# Patient Record
Sex: Female | Born: 1995
Health system: Southern US, Community
[De-identification: ages and names within clinical notes are randomized; demographics above are authoritative.]

## PROBLEM LIST (undated history)

## (undated) DIAGNOSIS — R51 Headache: Secondary | ICD-10-CM

## (undated) DIAGNOSIS — J45909 Unspecified asthma, uncomplicated: Secondary | ICD-10-CM

## (undated) DIAGNOSIS — R519 Headache, unspecified: Secondary | ICD-10-CM

## (undated) DIAGNOSIS — T7840XA Allergy, unspecified, initial encounter: Secondary | ICD-10-CM

## (undated) DIAGNOSIS — D649 Anemia, unspecified: Secondary | ICD-10-CM

## (undated) HISTORY — DX: Unspecified asthma, uncomplicated: J45.909

## (undated) HISTORY — PX: MOUTH SURGERY: SHX715

## (undated) HISTORY — DX: Headache, unspecified: R51.9

## (undated) HISTORY — DX: Anemia, unspecified: D64.9

## (undated) HISTORY — DX: Headache: R51

## (undated) HISTORY — DX: Allergy, unspecified, initial encounter: T78.40XA

---
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
## 2017-02-13 ENCOUNTER — Ambulatory Visit: Payer: BLUE CROSS/BLUE SHIELD | Admitting: Obstetrics & Gynecology

## 2017-02-19 ENCOUNTER — Ambulatory Visit (INDEPENDENT_AMBULATORY_CARE_PROVIDER_SITE_OTHER): Payer: BLUE CROSS/BLUE SHIELD | Admitting: Obstetrics and Gynecology

## 2017-02-19 ENCOUNTER — Encounter: Payer: Self-pay | Admitting: Obstetrics and Gynecology

## 2017-02-19 VITALS — BP 121/82 | HR 92 | Ht 62.86 in | Wt 197.8 lb

## 2017-02-19 DIAGNOSIS — Z113 Encounter for screening for infections with a predominantly sexual mode of transmission: Secondary | ICD-10-CM | POA: Diagnosis not present

## 2017-02-19 DIAGNOSIS — N761 Subacute and chronic vaginitis: Secondary | ICD-10-CM | POA: Diagnosis not present

## 2017-02-19 NOTE — Patient Instructions (Addendum)
Contraception Choices Contraception (birth control) is the use of any methods or devices to prevent pregnancy. Below are some methods to help avoid pregnancy. Hormonal methods  Contraceptive implant. This is a thin, plastic tube containing progesterone hormone. It does not contain estrogen hormone. Your health care provider inserts the tube in the inner part of the upper arm. The tube can remain in place for up to 3 years. After 3 years, the implant must be removed. The implant prevents the ovaries from releasing an egg (ovulation), thickens the cervical mucus to prevent sperm from entering the uterus, and thins the lining of the inside of the uterus.  Progesterone-only injections. These injections are given every 3 months by your health care provider to prevent pregnancy. This synthetic progesterone hormone stops the ovaries from releasing eggs. It also thickens cervical mucus and changes the uterine lining. This makes it harder for sperm to survive in the uterus.  Birth control pills. These pills contain estrogen and progesterone hormone. They work by preventing the ovaries from releasing eggs (ovulation). They also cause the cervical mucus to thicken, preventing the sperm from entering the uterus. Birth control pills are prescribed by a health care provider.Birth control pills can also be used to treat heavy periods.  Minipill. This type of birth control pill contains only the progesterone hormone. They are taken every day of each month and must be prescribed by your health care provider.  Birth control patch. The patch contains hormones similar to those in birth control pills. It must be changed once a week and is prescribed by a health care provider.  Vaginal ring. The ring contains hormones similar to those in birth control pills. It is left in the vagina for 3 weeks, removed for 1 week, and then a new one is put back in place. The patient must be comfortable inserting and removing the ring from  the vagina.A health care provider's prescription is necessary.  Emergency contraception. Emergency contraceptives prevent pregnancy after unprotected sexual intercourse. This pill can be taken right after sex or up to 5 days after unprotected sex. It is most effective the sooner you take the pills after having sexual intercourse. Most emergency contraceptive pills are available without a prescription. Check with your pharmacist. Do not use emergency contraception as your only form of birth control. Barrier methods  Female condom. This is a thin sheath (latex or rubber) that is worn over the penis during sexual intercourse. It can be used with spermicide to increase effectiveness.  Female condom. This is a soft, loose-fitting sheath that is put into the vagina before sexual intercourse.  Diaphragm. This is a soft, latex, dome-shaped barrier that must be fitted by a health care provider. It is inserted into the vagina, along with a spermicidal jelly. It is inserted before intercourse. The diaphragm should be left in the vagina for 6 to 8 hours after intercourse.  Cervical cap. This is a round, soft, latex or plastic cup that fits over the cervix and must be fitted by a health care provider. The cap can be left in place for up to 48 hours after intercourse.  Sponge. This is a soft, circular piece of polyurethane foam. The sponge has spermicide in it. It is inserted into the vagina after wetting it and before sexual intercourse.  Spermicides. These are chemicals that kill or block sperm from entering the cervix and uterus. They come in the form of creams, jellies, suppositories, foam, or tablets. They do not require a prescription. They   are inserted into the vagina with an applicator before having sexual intercourse. The process must be repeated every time you have sexual intercourse. Intrauterine contraception  Intrauterine device (IUD). This is a T-shaped device that is put in a woman's uterus during  a menstrual period to prevent pregnancy. There are 2 types: ? Copper IUD. This type of IUD is wrapped in copper wire and is placed inside the uterus. Copper makes the uterus and fallopian tubes produce a fluid that kills sperm. It can stay in place for 10 years. ? Hormone IUD. This type of IUD contains the hormone progestin (synthetic progesterone). The hormone thickens the cervical mucus and prevents sperm from entering the uterus, and it also thins the uterine lining to prevent implantation of a fertilized egg. The hormone can weaken or kill the sperm that get into the uterus. It can stay in place for 3-5 years, depending on which type of IUD is used. Permanent methods of contraception  Female tubal ligation. This is when the woman's fallopian tubes are surgically sealed, tied, or blocked to prevent the egg from traveling to the uterus.  Hysteroscopic sterilization. This involves placing a small coil or insert into each fallopian tube. Your doctor uses a technique called hysteroscopy to do the procedure. The device causes scar tissue to form. This results in permanent blockage of the fallopian tubes, so the sperm cannot fertilize the egg. It takes about 3 months after the procedure for the tubes to become blocked. You must use another form of birth control for these 3 months.  Female sterilization. This is when the female has the tubes that carry sperm tied off (vasectomy).This blocks sperm from entering the vagina during sexual intercourse. After the procedure, the man can still ejaculate fluid (semen). Natural planning methods  Natural family planning. This is not having sexual intercourse or using a barrier method (condom, diaphragm, cervical cap) on days the woman could become pregnant.  Calendar method. This is keeping track of the length of each menstrual cycle and identifying when you are fertile.  Ovulation method. This is avoiding sexual intercourse during ovulation.  Symptothermal method.  This is avoiding sexual intercourse during ovulation, using a thermometer and ovulation symptoms.  Post-ovulation method. This is timing sexual intercourse after you have ovulated. Regardless of which type or method of contraception you choose, it is important that you use condoms to protect against the transmission of sexually transmitted infections (STIs). Talk with your health care provider about which form of contraception is most appropriate for you. This information is not intended to replace advice given to you by your health care provider. Make sure you discuss any questions you have with your health care provider. Document Released: 05/01/2005 Document Revised: 10/07/2015 Document Reviewed: 10/24/2012 Elsevier Interactive Patient Education  2017 [employer]   Vaginitis Vaginitis is a condition in which the vaginal tissue swells and becomes red (inflamed). This condition is most often caused by a change in the normal balance of bacteria and yeast that live in the vagina. This change causes an overgrowth of certain bacteria or yeast, which causes the inflammation. There are different types of vaginitis, but the most common types are:  Bacterial vaginosis.  Yeast infection (candidiasis).  Trichomoniasis vaginitis. This is a sexually transmitted disease (STD).  Viral vaginitis.  Atrophic vaginitis.  Allergic vaginitis.  What are the causes? The cause of this condition depends on the type of vaginitis. It can be caused by:  Bacteria (bacterial vaginosis).  Yeast, which is a  fungus (yeast infection).  A parasite (trichomoniasis vaginitis).  A virus (viral vaginitis).  Low hormone levels (atrophic vaginitis). Low hormone levels can occur during pregnancy, breastfeeding, or after menopause.  Irritants, such as bubble baths, scented tampons, and feminine sprays (allergic vaginitis).  Other factors can change the normal balance of the yeast and bacteria that live in the  vagina. These include:  Antibiotic medicines.  Poor hygiene.  Diaphragms, vaginal sponges, spermicides, birth control pills, and intrauterine devices (IUD).  Sex.  Infection.  Uncontrolled diabetes.  A weakened defense (immune) system.  What increases the risk? This condition is more likely to develop in women who:  Smoke.  Use vaginal douches, scented tampons, or scented sanitary pads.  Wear tight-fitting pants.  Wear thong underwear.  Use oral birth control pills or an IUD.  Have sex without a condom.  Have multiple sex partners.  Have an STD.  Frequently use the spermicide [id number]-9.  Eat lots of foods high in sugar.  Have uncontrolled diabetes.  Have low estrogen levels.  Have a weakened immune system from an immune disorder or medical treatment.  Are pregnant or breastfeeding.  What are the signs or symptoms? Symptoms vary depending on the cause of the vaginitis. Common symptoms include:  Abnormal vaginal discharge. ? The discharge is [place], gray, or yellow with bacterial vaginosis. ? The discharge is thick, Ever[place], and cheesy with a yeast infection. ? The discharge is frothy and yellow or greenish with trichomoniasis.  A bad vaginal smell. The smell is fishy with bacterial vaginosis.  Vaginal itching, pain, or swelling.  Sex that is painful.  Pain or burning when urinating.  Sometimes there are no symptoms. How is this diagnosed? This condition is diagnosed based on your symptoms and medical history. A physical exam, including a pelvic exam, will also be done. You may also have other tests, including:  Tests to determine the pH level (acidity or alkalinity) of your vagina.  A whiff test, to assess the odor that results when a sample of your vaginal discharge is mixed with a potassium hydroxide solution.  Tests of vaginal fluid. A sample will be examined under a microscope.  How is this treated? Treatment varies depending on the type of  vaginitis you have. Your treatment may include:  Antibiotic creams or pills to treat bacterial vaginosis and trichomoniasis.  Antifungal medicines, such as vaginal creams or suppositories, to treat a yeast infection.  Medicine to ease discomfort if you have viral vaginitis. Your sexual partner should also be treated.  Estrogen delivered in a cream, pill, suppository, or vaginal ring to treat atrophic vaginitis. If vaginal dryness occurs, lubricants and moisturizing creams may help. You may need to avoid scented soaps, sprays, or douches.  Stopping use of a product that is causing allergic vaginitis. Then using a vaginal cream to treat the symptoms.  Follow these instructions at home: Lifestyle  Keep your genital area clean and dry. Avoid soap, and only rinse the area with water.  Do not douche or use tampons until your health care provider says it is okay to do so. Use sanitary pads, if needed.  Do not have sex until your health care provider approves. When you can return to sex, practice safe sex and use condoms.  Wipe from front to back. This avoids the spread of bacteria from the rectum to the vagina. General instructions  Take over-the-counter and prescription medicines only as told by your health care provider.  If you were prescribed an antibiotic medicine,  take or use it as told by your health care provider. Do not stop taking or using the antibiotic even if you start to feel better.  Keep all follow-up visits as told by your health care provider. This is important. How is this prevented?  Use mild, non-scented products. Do not use things that can irritate the vagina, such as fabric softeners. Avoid the following products if they are scented: ? Feminine sprays. ? Detergents. ? Tampons. ? Feminine hygiene products. ? Soaps or bubble baths.  Let air reach your genital area. ? Wear cotton underwear to reduce moisture buildup. ? Avoid wearing underwear while you  sleep. ? Avoid wearing tight pants and underwear or nylons without a cotton panel. ? Avoid wearing thong underwear.  Take off any wet clothing, such as bathing suits, as soon as possible.  Practice safe sex and use condoms. Contact a health care provider if:  You have abdominal pain.  You have a fever.  You have symptoms that last for more than 2-3 days. Get help right away if:  You have a fever and your symptoms suddenly get worse. Summary  Vaginitis is a condition in which the vaginal tissue becomes inflamed.This condition is most often caused by a change in the normal balance of bacteria and yeast that live in the vagina.  Treatment varies depending on the type of vaginitis you have.  Do not douche, use tampons , or have sex until your health care provider approves. When you can return to sex, practice safe sex and use condoms. This information is not intended to replace advice given to you by your health care provider. Make sure you discuss any questions you have with your health care provider. Document Released: 02/26/2007 Document Revised: 06/06/2016 Document Reviewed: 06/06/2016 Elsevier Interactive Patient Education  [employer].

## 2017-02-19 NOTE — Progress Notes (Signed)
21 yo G0 here for the evaluation of abnormal vaginal discharge which has been present daily for the past 2 months. Patient reports the presence of a Odom-grayish discharge at times with a foul odor and at times associated with pruritis. She is sexually active without complaints. She uses condoms intermittently with the same partner. She is not seeking pregnancy. She reports a normal monthly period of 5 days. She denies any pelvic pain.  Past Medical History:  Diagnosis Date  . Allergy   . Anemia   . Asthma   . Headache    Past Surgical History:  Procedure Laterality Date  . MOUTH SURGERY    . TONSILLECTOMY AND ADENOIDECTOMY Bilateral 2011   Family History  Problem Relation Age of Onset  . Migraines Mother   . ADD / ADHD Mother   . Depression Mother   . Anxiety disorder Mother   . Alcohol abuse Father   . Migraines Maternal Grandmother   . Asthma Maternal Grandfather    Social History  Substance Use Topics  . Smoking status: Never Smoker  . Smokeless tobacco: Never Used  . Alcohol use No   ROS See pertinent in HPI  Blood pressure 121/82, pulse 92, height 5' 2.86" (1.597 m), weight 197 lb 12.8 oz (89.7 kg), last menstrual period 01/23/2017. GENERAL: Well-developed, well-nourished female in no acute distress.  ABDOMEN: Soft, nontender, nondistended. No organomegaly. PELVIC: Normal external female genitalia. Vagina is pink and rugated.  Normal discharge. Normal appearing cervix. Uterus is normal in size. No adnexal mass or tenderness. EXTREMITIES: No cyanosis, clubbing, or edema, 2+ distal pulses.  A/P 21 yo G0 with chronic vaginitis - cultures and wet prep collected - Discussed contraception options but patient is undecided at the moment - Reviewed most fertile time during menstrual cycle and encouraged patient to use condoms or abstain during that time - RTC when ready for contraception - reviewed perineal hygiene - Patient will be contacted with abnormal results

## 2017-02-20 LAB — CERVICOVAGINAL ANCILLARY ONLY
Bacterial vaginitis: POSITIVE — AB
Candida vaginitis: NEGATIVE
Chlamydia: NEGATIVE
Neisseria Gonorrhea: NEGATIVE
Trichomonas: NEGATIVE

## 2017-02-21 ENCOUNTER — Other Ambulatory Visit: Payer: Self-pay | Admitting: Obstetrics and Gynecology

## 2017-02-21 ENCOUNTER — Telehealth: Payer: Self-pay

## 2017-02-21 MED ORDER — METRONIDAZOLE 500 MG PO TABS: 500.0000 mg | ORAL_TABLET | Freq: Two times a day (BID) | ORAL | 0 refills | Status: DC

## 2017-02-21 NOTE — Telephone Encounter (Signed)
Pt informed of results, and that rx has been sent to pharmacy 

## 2017-02-21 NOTE — Telephone Encounter (Signed)
-----   Message from Catalina Antigua, MD sent at 02/21/2017 10:56 AM EDT ----- Please inform patient of BV infection. Rx has been e-prescribed  Animator

## 2017-03-06 ENCOUNTER — Telehealth: Payer: Self-pay

## 2017-03-06 MED ORDER — FLUCONAZOLE 150 MG PO TABS
150.0000 mg | ORAL_TABLET | Freq: Once | ORAL | 0 refills | Status: AC
Start: 2017-03-06 — End: 2017-03-06

## 2017-03-06 NOTE — Telephone Encounter (Signed)
Pt now aware

## 2017-03-06 NOTE — Telephone Encounter (Signed)
Received VM from pt. Recent ABx use. Pt c/o yeast infection. Unable to LVM for pt.

## 2017-03-22 DIAGNOSIS — Z23 Encounter for immunization: Secondary | ICD-10-CM | POA: Diagnosis not present

## 2017-05-16 ENCOUNTER — Encounter: Payer: Self-pay | Admitting: Obstetrics and Gynecology

## 2017-05-16 ENCOUNTER — Ambulatory Visit (INDEPENDENT_AMBULATORY_CARE_PROVIDER_SITE_OTHER): Payer: BLUE CROSS/BLUE SHIELD | Admitting: Obstetrics and Gynecology

## 2017-05-16 VITALS — BP 141/82 | HR 120 | Wt 204.7 lb

## 2017-05-16 DIAGNOSIS — N760 Acute vaginitis: Secondary | ICD-10-CM

## 2017-05-16 DIAGNOSIS — Z113 Encounter for screening for infections with a predominantly sexual mode of transmission: Secondary | ICD-10-CM | POA: Diagnosis not present

## 2017-05-16 NOTE — Progress Notes (Signed)
22 yo G0 here for the evaluation of intermittent [place] discharge for the past 2 weeks. Patient states the discharge is associated with intermittent odor and pruritis. She is sexually active using condoms occasionally. She denies any pelvic pain. She has a normal 5-day period.  Past Medical History:  Diagnosis Date  . Allergy   . Anemia   . Asthma   . Headache    Past Surgical History:  Procedure Laterality Date  . MOUTH SURGERY    . TONSILLECTOMY AND ADENOIDECTOMY Bilateral 2011   Family History  Problem Relation Age of Onset  . Migraines Mother   . ADD / ADHD Mother   . Depression Mother   . Anxiety disorder Mother   . Alcohol abuse Father   . Migraines Maternal Grandmother   . Asthma Maternal Grandfather    Social History   Tobacco Use  . Smoking status: Never Smoker  . Smokeless tobacco: Never Used  Substance Use Topics  . Alcohol use: No    Alcohol/week: 0.0 oz  . Drug use: No   ROS See pertinent in HPI Blood pressure (!) 141/82, pulse (!) 120, weight 204 lb 11.2 oz (92.9 kg), last menstrual period 04/21/2017. GENERAL: Well-developed, well-nourished female in no acute distress.  ABDOMEN: Soft, nontender, nondistended. No organomegaly. PELVIC: Normal external female genitalia. Vagina is pink and rugated.  Normal [place] discharge. Normal appearing cervix. Uterus is normal in size. No adnexal mass or tenderness. EXTREMITIES: No cyanosis, clubbing, or edema, 2+ distal pulses.  A/P 22 yo with vaginitis - wet prep and cultures collected - patient will be contacted with abnormal results - vulva care reviewed

## 2017-05-16 NOTE — Patient Instructions (Addendum)
Vaginitis Vaginitis is a condition in which the vaginal tissue swells and becomes red (inflamed). This condition is most often caused by a change in the normal balance of bacteria and yeast that live in the vagina. This change causes an overgrowth of certain bacteria or yeast, which causes the inflammation. There are different types of vaginitis, but the most common types are:  Bacterial vaginosis.  Yeast infection (candidiasis).  Trichomoniasis vaginitis. This is a sexually transmitted disease (STD).  Viral vaginitis.  Atrophic vaginitis.  Allergic vaginitis.  What are the causes? The cause of this condition depends on the type of vaginitis. It can be caused by:  Bacteria (bacterial vaginosis).  Yeast, which is a fungus (yeast infection).  A parasite (trichomoniasis vaginitis).  A virus (viral vaginitis).  Low hormone levels (atrophic vaginitis). Low hormone levels can occur during pregnancy, breastfeeding, or after menopause.  Irritants, such as bubble baths, scented tampons, and feminine sprays (allergic vaginitis).  Other factors can change the normal balance of the yeast and bacteria that live in the vagina. These include:  Antibiotic medicines.  Poor hygiene.  Diaphragms, vaginal sponges, spermicides, birth control pills, and intrauterine devices (IUD).  Sex.  Infection.  Uncontrolled diabetes.  A weakened defense (immune) system.  What increases the risk? This condition is more likely to develop in women who:  Smoke.  Use vaginal douches, scented tampons, or scented sanitary pads.  Wear tight-fitting pants.  Wear thong underwear.  Use oral birth control pills or an IUD.  Have sex without a condom.  Have multiple sex partners.  Have an STD.  Frequently use the spermicide nonoxynol-9.  Eat lots of foods high in sugar.  Have uncontrolled diabetes.  Have low estrogen levels.  Have a weakened immune system from an immune disorder or medical  treatment.  Are pregnant or breastfeeding.  What are the signs or symptoms? Symptoms vary depending on the cause of the vaginitis. Common symptoms include:  Abnormal vaginal discharge. ? The discharge is Yost, gray, or yellow with bacterial vaginosis. ? The discharge is thick, Pfen[place], and cheesy with a yeast infection. ? The discharge is frothy and yellow or greenish with trichomoniasis.  A bad vaginal smell. The smell is fishy with bacterial vaginosis.  Vaginal itching, pain, or swelling.  Sex that is painful.  Pain or burning when urinating.  Sometimes there are no symptoms. How is this diagnosed? This condition is diagnosed based on your symptoms and medical history. A physical exam, including a pelvic exam, will also be done. You may also have other tests, including:  Tests to determine the pH level (acidity or alkalinity) of your vagina.  A whiff test, to assess the odor that results when a sample of your vaginal discharge is mixed with a potassium hydroxide solution.  Tests of vaginal fluid. A sample will be examined under a microscope.  How is this treated? Treatment varies depending on the type of vaginitis you have. Your treatment may include:  Antibiotic creams or pills to treat bacterial vaginosis and trichomoniasis.  Antifungal medicines, such as vaginal creams or suppositories, to treat a yeast infection.  Medicine to ease discomfort if you have viral vaginitis. Your sexual partner should also be treated.  Estrogen delivered in a cream, pill, suppository, or vaginal ring to treat atrophic vaginitis. If vaginal dryness occurs, lubricants and moisturizing creams may help. You may need to avoid scented soaps, sprays, or douches.  Stopping use of a product that is causing allergic vaginitis. Then using a vaginal  cream to treat the symptoms.  Follow these instructions at home: Lifestyle  Keep your genital area clean and dry. Avoid soap, and only rinse the area  with water.  Do not douche or use tampons until your health care provider says it is okay to do so. Use sanitary pads, if needed.  Do not have sex until your health care provider approves. When you can return to sex, practice safe sex and use condoms.  Wipe from front to back. This avoids the spread of bacteria from the rectum to the vagina. General instructions  Take over-the-counter and prescription medicines only as told by your health care provider.  If you were prescribed an antibiotic medicine, take or use it as told by your health care provider. Do not stop taking or using the antibiotic even if you start to feel better.  Keep all follow-up visits as told by your health care provider. This is important. How is this prevented?  Use mild, non-scented products. Do not use things that can irritate the vagina, such as fabric softeners. Avoid the following products if they are scented: ? Feminine sprays. ? Detergents. ? Tampons. ? Feminine hygiene products. ? Soaps or bubble baths.  Let air reach your genital area. ? Wear cotton underwear to reduce moisture buildup. ? Avoid wearing underwear while you sleep. ? Avoid wearing tight pants and underwear or nylons without a cotton panel. ? Avoid wearing thong underwear.  Take off any wet clothing, such as bathing suits, as soon as possible.  Practice safe sex and use condoms. Contact a health care provider if:  You have abdominal pain.  You have a fever.  You have symptoms that last for more than 2-3 days. Get help right away if:  You have a fever and your symptoms suddenly get worse. Summary  Vaginitis is a condition in which the vaginal tissue becomes inflamed.This condition is most often caused by a change in the normal balance of bacteria and yeast that live in the vagina.  Treatment varies depending on the type of vaginitis you have.  Do not douche, use tampons , or have sex until your health care provider approves.  When you can return to sex, practice safe sex and use condoms. This information is not intended to replace advice given to you by your health care provider. Make sure you discuss any questions you have with your health care provider. Document Released: 02/26/2007 Document Revised: 06/06/2016 Document Reviewed: 06/06/2016 Elsevier Interactive Patient Education  2018 [employer].   Contraception Choices Contraception, also called birth control, refers to methods or devices that prevent pregnancy. Hormonal methods Contraceptive implant A contraceptive implant is a thin, plastic tube that contains a hormone. It is inserted into the upper part of the arm. It can remain in place for up to 3 years. Progestin-only injections Progestin-only injections are injections of progestin, a synthetic form of the hormone progesterone. They are given every 3 months by a health care provider. Birth control pills Birth control pills are pills that contain hormones that prevent pregnancy. They must be taken once a day, preferably at the same time each day. Birth control patch The birth control patch contains hormones that prevent pregnancy. It is placed on the skin and must be changed once a week for three weeks and removed on the fourth week. A prescription is needed to use this method of contraception. Vaginal ring A vaginal ring contains hormones that prevent pregnancy. It is placed in the vagina for three weeks  and removed on the fourth week. After that, the process is repeated with a new ring. A prescription is needed to use this method of contraception. Emergency contraceptive Emergency contraceptives prevent pregnancy after unprotected sex. They come in pill form and can be taken up to 5 days after sex. They work best the sooner they are taken after having sex. Most emergency contraceptives are available without a prescription. This method should not be used as your only form of birth control. Barrier  methods Female condom A female condom is a thin sheath that is worn over the penis during sex. Condoms keep sperm from going inside a woman's body. They can be used with a spermicide to increase their effectiveness. They should be disposed after a single use. Female condom A female condom is a soft, loose-fitting sheath that is put into the vagina before sex. The condom keeps sperm from going inside a woman's body. They should be disposed after a single use. Diaphragm A diaphragm is a soft, dome-shaped barrier. It is inserted into the vagina before sex, along with a spermicide. The diaphragm blocks sperm from entering the uterus, and the spermicide kills sperm. A diaphragm should be left in the vagina for 6-8 hours after sex and removed within 24 hours. A diaphragm is prescribed and fitted by a health care provider. A diaphragm should be replaced every 1-2 years, after giving birth, after gaining more than 15 lb (6.8 kg), and after pelvic surgery. Cervical cap A cervical cap is a round, soft latex or plastic cup that fits over the cervix. It is inserted into the vagina before sex, along with spermicide. It blocks sperm from entering the uterus. The cap should be left in place for 6-8 hours after sex and removed within 48 hours. A cervical cap must be prescribed and fitted by a health care provider. It should be replaced every 2 years. Sponge A sponge is a soft, circular piece of polyurethane foam with spermicide on it. The sponge helps block sperm from entering the uterus, and the spermicide kills sperm. To use it, you make it wet and then insert it into the vagina. It should be inserted before sex, left in for at least 6 hours after sex, and removed and thrown away within 30 hours. Spermicides Spermicides are chemicals that kill or block sperm from entering the cervix and uterus. They can come as a cream, jelly, suppository, foam, or tablet. A spermicide should be inserted into the vagina with an  applicator at least 10-15 minutes before sex to allow time for it to work. The process must be repeated every time you have sex. Spermicides do not require a prescription. Intrauterine contraception Intrauterine device (IUD) An IUD is a T-shaped device that is put in a woman's uterus. There are two types:  Hormone IUD.This type contains progestin, a synthetic form of the hormone progesterone. This type can stay in place for 3-5 years.  Copper IUD.This type is wrapped in copper wire. It can stay in place for 10 years.  Permanent methods of contraception Female tubal ligation In this method, a woman's fallopian tubes are sealed, tied, or blocked during surgery to prevent eggs from traveling to the uterus. Hysteroscopic sterilization In this method, a small, flexible insert is placed into each fallopian tube. The inserts cause scar tissue to form in the fallopian tubes and block them, so sperm cannot reach an egg. The procedure takes about 3 months to be effective. Another form of birth control must be used  during those 3 months. Female sterilization This is a procedure to tie off the tubes that carry sperm (vasectomy). After the procedure, the man can still ejaculate fluid (semen). Natural planning methods Natural family planning In this method, a couple does not have sex on days when the woman could become pregnant. Calendar method This means keeping track of the length of each menstrual cycle, identifying the days when pregnancy can happen, and not having sex on those days. Ovulation method In this method, a couple avoids sex during ovulation. Symptothermal method This method involves not having sex during ovulation. The woman typically checks for ovulation by watching changes in her temperature and in the consistency of cervical mucus. Post-ovulation method In this method, a couple waits to have sex until after ovulation. Summary  Contraception, also called birth control, means methods or  devices that prevent pregnancy.  Hormonal methods of contraception include implants, injections, pills, patches, vaginal rings, and emergency contraceptives.  Barrier methods of contraception can include female condoms, female condoms, diaphragms, cervical caps, sponges, and spermicides.  There are two types of IUDs (intrauterine devices). An IUD can be put in a woman's uterus to prevent pregnancy for 3-5 years.  Permanent sterilization can be done through a procedure for males, females, or both.  Natural family planning methods involve not having sex on days when the woman could become pregnant. This information is not intended to replace advice given to you by your health care provider. Make sure you discuss any questions you have with your health care provider. Document Released: 05/01/2005 Document Revised: 06/03/2016 Document Reviewed: 06/03/2016 Elsevier Interactive Patient Education  2018 [employer].

## 2017-05-16 NOTE — Progress Notes (Signed)
Pt c/o vaginal irration worst on outer vulva. Reports vaginal discharge with odor. Pt also concerned about recurrent vaginal and inner thigh boils.

## 2017-05-17 LAB — CERVICOVAGINAL ANCILLARY ONLY
Bacterial vaginitis: NEGATIVE
Candida vaginitis: NEGATIVE
Chlamydia: NEGATIVE
Neisseria Gonorrhea: NEGATIVE
Trichomonas: NEGATIVE

## 2017-07-08 DIAGNOSIS — M25571 Pain in right ankle and joints of right foot: Secondary | ICD-10-CM | POA: Diagnosis not present

## 2017-07-08 DIAGNOSIS — E010 Iodine-deficiency related diffuse (endemic) goiter: Secondary | ICD-10-CM | POA: Diagnosis not present

## 2017-07-08 DIAGNOSIS — E668 Other obesity: Secondary | ICD-10-CM | POA: Diagnosis not present

## 2017-07-08 DIAGNOSIS — M129 Arthropathy, unspecified: Secondary | ICD-10-CM | POA: Diagnosis not present

## 2017-07-13 DIAGNOSIS — E010 Iodine-deficiency related diffuse (endemic) goiter: Secondary | ICD-10-CM | POA: Diagnosis not present

## 2017-09-07 DIAGNOSIS — L081 Erythrasma: Secondary | ICD-10-CM | POA: Diagnosis not present

## 2017-11-06 DIAGNOSIS — L309 Dermatitis, unspecified: Secondary | ICD-10-CM | POA: Diagnosis not present

## 2017-11-27 DIAGNOSIS — L300 Nummular dermatitis: Secondary | ICD-10-CM | POA: Diagnosis not present

## 2017-12-18 DIAGNOSIS — N390 Urinary tract infection, site not specified: Secondary | ICD-10-CM | POA: Diagnosis not present

## 2018-01-10 ENCOUNTER — Ambulatory Visit (INDEPENDENT_AMBULATORY_CARE_PROVIDER_SITE_OTHER): Payer: BLUE CROSS/BLUE SHIELD

## 2018-01-10 DIAGNOSIS — N898 Other specified noninflammatory disorders of vagina: Secondary | ICD-10-CM | POA: Diagnosis not present

## 2018-01-10 DIAGNOSIS — B393 Disseminated histoplasmosis capsulati: Secondary | ICD-10-CM | POA: Diagnosis not present

## 2018-01-10 DIAGNOSIS — N760 Acute vaginitis: Secondary | ICD-10-CM

## 2018-01-10 MED ORDER — FLUCONAZOLE 150 MG PO TABS: ORAL_TABLET | ORAL | 0 refills | Status: DC

## 2018-01-10 NOTE — Progress Notes (Signed)
Pt c/o red and irritated vulva. Denies vaginal discharge and odor. Self swab completed today. Diflucan sent today for relief per protocol.

## 2018-01-14 DIAGNOSIS — R51 Headache: Secondary | ICD-10-CM | POA: Diagnosis not present

## 2018-01-14 DIAGNOSIS — J069 Acute upper respiratory infection, unspecified: Secondary | ICD-10-CM | POA: Diagnosis not present

## 2018-01-16 LAB — CERVICOVAGINAL ANCILLARY ONLY
Bacterial vaginitis: NEGATIVE
Candida vaginitis: POSITIVE — AB

## 2018-01-21 ENCOUNTER — Other Ambulatory Visit: Payer: Self-pay

## 2018-01-21 MED ORDER — FLUCONAZOLE 150 MG PO TABS: 150.0000 mg | ORAL_TABLET | Freq: Once | ORAL | 0 refills | Status: AC

## 2018-01-21 NOTE — Progress Notes (Signed)
Pt was given an ABx for a cold and has another yeast infection. ABx treatment completed per pt. Diflucan sent to [facility] per protocol. Pt aware if sx's do not subside, to contact the office.

## 2018-02-11 ENCOUNTER — Ambulatory Visit (INDEPENDENT_AMBULATORY_CARE_PROVIDER_SITE_OTHER): Payer: BLUE CROSS/BLUE SHIELD | Admitting: Obstetrics and Gynecology

## 2018-02-11 ENCOUNTER — Encounter: Payer: Self-pay | Admitting: Obstetrics and Gynecology

## 2018-02-11 VITALS — BP 112/75 | HR 92 | Wt 179.0 lb

## 2018-02-11 DIAGNOSIS — Z3202 Encounter for pregnancy test, result negative: Secondary | ICD-10-CM

## 2018-02-11 DIAGNOSIS — Z3009 Encounter for other general counseling and advice on contraception: Secondary | ICD-10-CM | POA: Diagnosis not present

## 2018-02-11 DIAGNOSIS — Z113 Encounter for screening for infections with a predominantly sexual mode of transmission: Secondary | ICD-10-CM | POA: Diagnosis not present

## 2018-02-11 LAB — POCT URINE PREGNANCY: Preg Test, Ur: NEGATIVE

## 2018-02-11 MED ORDER — NORETHIN ACE-ETH ESTRAD-FE 1-20 MG-MCG(24) PO TABS: 1.0000 | ORAL_TABLET | Freq: Every day | ORAL | 4 refills | Status: DC

## 2018-02-11 NOTE — Progress Notes (Signed)
22 yo G0 presenting today for contraception. Patient is sexually active using condoms occasionally. She desires to get restarted on COC. She has used them in the past without complications. Patient also is requesting full STI testing. Patient denies any pelvic pain or abnormal discharge  Past Medical History:  Diagnosis Date  . Allergy   . Anemia   . Asthma   . Headache    Past Surgical History:  Procedure Laterality Date  . MOUTH SURGERY    . TONSILLECTOMY AND ADENOIDECTOMY Bilateral 2011   Family History  Problem Relation Age of Onset  . Migraines Mother   . ADD / ADHD Mother   . Depression Mother   . Anxiety disorder Mother   . Alcohol abuse Father   . Migraines Maternal Grandmother   . Asthma Maternal Grandfather    Social History   Tobacco Use  . Smoking status: Never Smoker  . Smokeless tobacco: Never Used  Substance Use Topics  . Alcohol use: No    Alcohol/week: 0.0 standard drinks  . Drug use: No   ROS See pertinent in HPI  Blood pressure 112/75, pulse 92, weight 179 lb (81.2 kg), last menstrual period 02/04/2018. GENERAL: Well-developed, well-nourished female in no acute distress.  ABDOMEN: Soft, nontender, nondistended. No organomegaly. NEURO: alert and oriented x 3  A/P 22 yo G0 here for contraception initiation and STI testing - STI screen collected - Rx Loestrin provided - Patient will be contacted with abnormal results - RTC in 1 year for annual exam

## 2018-02-11 NOTE — Progress Notes (Signed)
Pt presents for BCP.

## 2018-02-12 LAB — CERVICOVAGINAL ANCILLARY ONLY
Chlamydia: NEGATIVE
Neisseria Gonorrhea: NEGATIVE
Trichomonas: NEGATIVE

## 2018-02-13 LAB — RPR, QUANT+TP ABS (REFLEX)
Rapid Plasma Reagin, Quant: 1:1 {titer} — ABNORMAL HIGH
T Pallidum Abs: NEGATIVE

## 2018-02-13 LAB — HEPATITIS C ANTIBODY: Hep C Virus Ab: <0.1 s/co ratio (ref 0.0–0.9)

## 2018-02-13 LAB — HEPATITIS B SURFACE ANTIGEN: Hepatitis B Surface Ag: NEGATIVE

## 2018-02-13 LAB — HIV ANTIBODY (ROUTINE TESTING W REFLEX): HIV Screen 4th Generation wRfx: NONREACTIVE

## 2018-02-13 LAB — RPR: RPR Ser Ql: REACTIVE — AB

## 2018-02-20 DIAGNOSIS — R072 Precordial pain: Secondary | ICD-10-CM | POA: Diagnosis not present

## 2018-03-05 ENCOUNTER — Other Ambulatory Visit: Payer: Self-pay

## 2018-03-05 MED ORDER — FLUCONAZOLE 200 MG PO TABS: ORAL_TABLET | ORAL | 1 refills | Status: DC

## 2018-04-18 ENCOUNTER — Other Ambulatory Visit: Payer: Self-pay

## 2018-04-18 MED ORDER — METRONIDAZOLE 500 MG PO TABS: 500.0000 mg | ORAL_TABLET | Freq: Two times a day (BID) | ORAL | 0 refills | Status: DC

## 2018-04-18 MED ORDER — FLUCONAZOLE 150 MG PO TABS: 150.0000 mg | ORAL_TABLET | Freq: Once | ORAL | 3 refills | Status: AC

## 2018-08-13 ENCOUNTER — Other Ambulatory Visit: Payer: Self-pay

## 2018-08-13 MED ORDER — FLUCONAZOLE 150 MG PO TABS: 150.0000 mg | ORAL_TABLET | Freq: Once | ORAL | 0 refills | Status: AC

## 2018-08-13 MED ORDER — METRONIDAZOLE 500 MG PO TABS: 500.0000 mg | ORAL_TABLET | Freq: Two times a day (BID) | ORAL | 0 refills | Status: AC

## 2018-08-13 NOTE — Progress Notes (Signed)
Patient called with symptoms of vaginal discharge and odor, Rx flagyl sent for BV. Diflucan sent to yeast. Pt made aware and instructed to call back if symptoms persist.

## 2018-10-11 ENCOUNTER — Other Ambulatory Visit: Payer: Self-pay | Admitting: Obstetrics

## 2018-10-11 MED ORDER — PHENTERMINE HCL 37.5 MG PO CAPS
37.5000 mg | ORAL_CAPSULE | ORAL | 2 refills | Status: DC
Start: 2018-10-11 — End: 2019-01-23

## 2018-10-11 MED ORDER — PHENTERMINE HCL 37.5 MG PO CAPS
37.5000 mg | ORAL_CAPSULE | ORAL | 2 refills | Status: DC
Start: 2018-10-11 — End: 2018-10-11

## 2018-10-27 ENCOUNTER — Emergency Department
Admission: EM | Admit: 2018-10-27 | Discharge: 2018-10-28 | Disposition: A | Discharge status: Home / Self Care | Payer: BLUE CROSS/BLUE SHIELD | Attending: Emergency Medicine | Admitting: Emergency Medicine

## 2018-10-27 ENCOUNTER — Other Ambulatory Visit: Payer: Self-pay

## 2018-10-27 DIAGNOSIS — Z20828 Contact with and (suspected) exposure to other viral communicable diseases: Secondary | ICD-10-CM | POA: Diagnosis not present

## 2018-10-27 DIAGNOSIS — Z79899 Other long term (current) drug therapy: Secondary | ICD-10-CM | POA: Diagnosis not present

## 2018-10-27 DIAGNOSIS — R509 Fever, unspecified: Secondary | ICD-10-CM | POA: Diagnosis not present

## 2018-10-27 DIAGNOSIS — J029 Acute pharyngitis, unspecified: Secondary | ICD-10-CM | POA: Diagnosis not present

## 2018-10-27 DIAGNOSIS — B349 Viral infection, unspecified: Secondary | ICD-10-CM | POA: Diagnosis not present

## 2018-10-27 DIAGNOSIS — R51 Headache: Secondary | ICD-10-CM | POA: Diagnosis not present

## 2018-10-27 LAB — SARS CORONAVIRUS 2 BY RT PCR (HOSPITAL ORDER, PERFORMED IN ~~LOC~~ HOSPITAL LAB): SARS Coronavirus 2: NEGATIVE

## 2018-10-27 LAB — POCT PREGNANCY, URINE: Preg Test, Ur: NEGATIVE

## 2018-10-27 LAB — PREGNANCY, URINE: Preg Test, Ur: NEGATIVE

## 2018-10-27 MED ORDER — ACETAMINOPHEN 500 MG PO TABS
1000.0000 mg | ORAL_TABLET | Freq: Once | ORAL | Status: AC
  Administered 2018-10-27: 1000 mg via ORAL
  Filled 2018-10-27: qty 2

## 2018-10-27 MED ORDER — SODIUM CHLORIDE 0.9 % IV BOLUS
1000.0000 mL | Freq: Once | INTRAVENOUS | Status: AC
  Administered 2018-10-28: 01:00:00 1000 mL via INTRAVENOUS

## 2018-10-27 NOTE — ED Provider Notes (Signed)
[facility] Emergency Department Provider Note  Time seen: 10:31 PM  I have reviewed the triage vital signs and the nursing notes.   HISTORY  Chief Complaint Sore Throat and Chills   HPI Vickie Morales is a 23 y.o. female with a past medical history of anemia, presents to the emergency department for fever, body aches sore throat and headache.  According to the patient over the past 24 hours she has developed headache, fever, body aches and generalized weakness.  Patient states she works in [employer] and is concerned that she could have contracted coronavirus.  Patient denies any abdominal pain or chest pain.  No cough or shortness of breath.  No vomiting or diarrhea.  No dysuria.  Patient is noted to be quite tachycardic around 145 bpm upon arrival.  Around 120 bpm during my evaluation.  Has not taken Tylenol or ibuprofen.  Past Medical History:  Diagnosis Date  . Allergy   . Anemia   . Asthma   . Headache     Patient Active Problem List   Diagnosis Date Noted  . Tension headache, chronic 04/23/2015    Past Surgical History:  Procedure Laterality Date  . MOUTH SURGERY    . TONSILLECTOMY AND ADENOIDECTOMY Bilateral 2011    Prior to Admission medications   Medication Sig Start Date End Date Taking? Authorizing Provider  chlorzoxazone (PARAFON) 500 MG tablet Take 500 mg by mouth 2 (two) times daily.  04/13/15   [provider]  clotrimazole-betamethasone (LOTRISONE) cream  04/06/15   [provider]  fluconazole (DIFLUCAN) 150 MG tablet 1 tab po STAT Patient not taking: Reported on 02/11/2018 01/10/18   Adam PhenixArnold, James G, MD  fluconazole (DIFLUCAN) 200 MG tablet Take 1 tab po q.o.d x 3 days 03/05/18   Brock BadHarper, Charles A, MD  ibuprofen (ADVIL,MOTRIN) 200 MG tablet Take 400 mg by mouth every 6 (six) hours as needed.    [provider]  metroNIDAZOLE (FLAGYL) 500 MG tablet Take 1 tablet (500 mg total) by mouth 2 (two) times  daily. Patient not taking: Reported on 05/16/2017 02/21/17   Constant, Peggy, MD  metroNIDAZOLE (FLAGYL) 500 MG tablet Take 1 tablet (500 mg total) by mouth 2 (two) times daily. 04/18/18   Brock BadHarper, Charles A, MD  naproxen sodium (ANAPROX) 550 MG tablet Take 550 mg by mouth 2 (two) times daily with a meal.  04/15/15   [provider]  Norethindrone Acetate-Ethinyl Estrad-FE (LOESTRIN 24 FE) 1-20 MG-MCG(24) tablet Take 1 tablet by mouth daily. 02/11/18   Constant, Peggy, MD  phentermine 37.5 MG capsule Take 1 capsule (37.5 mg total) by mouth every morning. 10/11/18   Brock BadHarper, Charles A, MD  promethazine (PHENERGAN) 12.5 MG tablet 1-2 tablets every 6 hours as needed for headache Patient not taking: Reported on 02/19/2017 04/23/15   Lorenz CoasterWolfe, Stephanie, MD  SUMAtriptan (IMITREX) 100 MG tablet Take 100 mg by mouth every 2 (two) hours as needed.  04/15/15   [provider]    Allergies  Allergen Reactions  . Hydrocodone Swelling    Family History  Problem Relation Age of Onset  . Migraines Mother   . ADD / ADHD Mother   . Depression Mother   . Anxiety disorder Mother   . Alcohol abuse Father   . Migraines Maternal Grandmother   . Asthma Maternal Grandfather     Social History Social History   Tobacco Use  . Smoking status: Never Smoker  . Smokeless tobacco: Never Used  Substance  Use Topics  . Alcohol use: No    Alcohol/week: 0.0 standard drinks  . Drug use: No    Review of Systems Constitutional: Positive for fever at home. ENT: Positive for sore throat Cardiovascular: Negative for chest pain. Respiratory: Negative for shortness of breath.  Negative for cough. Gastrointestinal: Negative for abdominal pain, vomiting and diarrhea. Genitourinary: Negative for urinary compaints Musculoskeletal: Positive for body aches Skin: Negative for skin complaints  Neurological: Intermittent headache All other ROS negative  ____________________________________________   PHYSICAL  EXAM:  VITAL SIGNS: ED Triage Vitals  Enc Vitals Group     BP 10/27/18 2044 117/76     Pulse Rate 10/27/18 2044 (!) 145     Resp 10/27/18 2044 20     Temp 10/27/18 2044 99.6 F (37.6 C)     Temp Source 10/27/18 2044 Oral     SpO2 10/27/18 2044 100 %     Weight 10/27/18 2046 180 lb (81.6 kg)     Height 10/27/18 2046 5\' 3"  (1.6 m)     Head Circumference --      Peak Flow --      Pain Score 10/27/18 2054 6     Pain Loc --      Pain Edu? --      Excl. in [place]? --    Constitutional: Alert and oriented. Well appearing and in no distress. Eyes: Normal exam ENT      Head: Normocephalic and atraumatic.      Mouth/Throat: Mucous membranes are moist. Cardiovascular: Regular rhythm rate around 120 bpm.  No obvious murmur. Respiratory: Normal respiratory effort without tachypnea nor retractions. Breath sounds are clear Gastrointestinal: Soft and nontender. No distention.   Musculoskeletal: Nontender with normal range of motion in all extremities.  Neurologic:  Normal speech and language. No gross focal neurologic deficits  Skin:  Skin is warm, dry and intact.  Psychiatric: Mood and affect are normal.   ____________________________________________    EKG  EKG viewed and interpreted by myself shows sinus tachycardia at 136 bpm with a narrow QRS, normal axis, normal intervals, no concerning ST changes.  ____________________________________________   INITIAL IMPRESSION / ASSESSMENT AND PLAN / ED COURSE  Pertinent labs & imaging results that were available during my care of the patient were reviewed by me and considered in my medical decision making (see chart for details).   Patient presents to the emergency department with fever, sore throat, weakness, body aches and headache.  Differential would include viral illness such as coronavirus.  Patient denies any shortness of breath or cough.  Patient is tachycardic around 120 bpm during my evaluation.  We will dose Tylenol, check for  coronavirus and continue to closely monitor.  Patient agreeable to plan of care.  Coronavirus test pending.  Patient care signed out to oncoming physician.  Vickie Morales was evaluated in Emergency Department on 10/27/2018 for the symptoms described in the history of present illness. She was evaluated in the context of the global COVID-19 pandemic, which necessitated consideration that the patient might be at risk for infection with the SARS-CoV-2 virus that causes COVID-19. Institutional protocols and algorithms that pertain to the evaluation of patients at risk for COVID-19 are in a state of rapid change based on information released by regulatory bodies including the CDC and federal and state organizations. These policies and algorithms were followed during the patient's care in the ED.  ____________________________________________   FINAL CLINICAL IMPRESSION(S) / ED DIAGNOSES  Viral syndrome   Joya Willmott,  Caryn BeeKevin, MD 10/27/18 2252

## 2018-10-27 NOTE — ED Triage Notes (Signed)
Pt states "I have covid symptoms". Pt states sore throat, headache, chills since yesterday. Pt with hr of 145, is on phenermine for weight loss. Pt appears in no acute distress.

## 2018-10-28 ENCOUNTER — Emergency Department: Payer: BLUE CROSS/BLUE SHIELD

## 2018-10-28 DIAGNOSIS — J029 Acute pharyngitis, unspecified: Secondary | ICD-10-CM | POA: Diagnosis not present

## 2018-10-28 DIAGNOSIS — R509 Fever, unspecified: Secondary | ICD-10-CM | POA: Diagnosis not present

## 2018-10-28 DIAGNOSIS — R51 Headache: Secondary | ICD-10-CM | POA: Diagnosis not present

## 2018-10-28 LAB — COMPREHENSIVE METABOLIC PANEL
ALT: 19 U/L (ref 0–44)
AST: 18 U/L (ref 15–41)
Albumin: 4.9 g/dL (ref 3.5–5.0)
Alkaline Phosphatase: 62 U/L (ref 38–126)
Anion gap: 11 (ref 5–15)
BUN: 9 mg/dL (ref 6–20)
CO2: 24 mmol/L (ref 22–32)
Calcium: 9.3 mg/dL (ref 8.9–10.3)
Chloride: 101 mmol/L (ref 98–111)
Creatinine, Ser: 0.71 mg/dL (ref 0.44–1.00)
GFR calc Af Amer: >60 mL/min (ref >60)
GFR calc non Af Amer: >60 mL/min (ref >60)
Glucose, Bld: 109 mg/dL — ABNORMAL HIGH (ref 70–99)
Potassium: 3.8 mmol/L (ref 3.5–5.1)
Sodium: 136 mmol/L (ref 135–145)
Total Bilirubin: 0.8 mg/dL (ref 0.3–1.2)
Total Protein: 8.3 g/dL — ABNORMAL HIGH (ref 6.5–8.1)

## 2018-10-28 LAB — URINALYSIS, COMPLETE (UACMP) WITH MICROSCOPIC
Bacteria, UA: NONE SEEN
Bilirubin Urine: NEGATIVE
Glucose, UA: NEGATIVE mg/dL
Hgb urine dipstick: NEGATIVE
Ketones, ur: 20 mg/dL — AB
Leukocytes,Ua: NEGATIVE
Nitrite: NEGATIVE
Protein, ur: 30 mg/dL — AB
Specific Gravity, Urine: 1.034 — ABNORMAL HIGH (ref 1.005–1.030)
WBC, UA: NONE SEEN WBC/hpf (ref 0–5)
pH: 5 (ref 5.0–8.0)

## 2018-10-28 LAB — CBC
HCT: 40.1 % (ref 36.0–46.0)
Hemoglobin: 12.6 g/dL (ref 12.0–15.0)
MCH: 24.6 pg — ABNORMAL LOW (ref 26.0–34.0)
MCHC: 31.4 g/dL (ref 30.0–36.0)
MCV: 78.2 fL — ABNORMAL LOW (ref 80.0–100.0)
Platelets: 298 10*3/uL (ref 150–400)
RBC: 5.13 MIL/uL — ABNORMAL HIGH (ref 3.87–5.11)
RDW: 14.3 % (ref 11.5–15.5)
WBC: 11.1 10*3/uL — ABNORMAL HIGH (ref 4.0–10.5)
nRBC: 0 % (ref 0.0–0.2)

## 2018-10-28 LAB — TROPONIN I: Troponin I: <0.03 ng/mL (ref <0.03)

## 2018-10-28 LAB — GROUP A STREP BY PCR: Group A Strep by PCR: NOT DETECTED

## 2018-10-28 LAB — MONONUCLEOSIS SCREEN: Mono Screen: NEGATIVE

## 2018-10-28 NOTE — ED Notes (Signed)
ED Provider at bedside. 

## 2018-10-28 NOTE — Discharge Instructions (Signed)
Person Under Monitoring Name: Vickie Morales  Location: [address] [place] [address]   Infection Prevention Recommendations for Individuals Confirmed to have, or Being Evaluated for, 2019 Novel Coronavirus (COVID-19) Infection Who Receive Care at Home  Individuals who are confirmed to have, or are being evaluated for, COVID-19 should follow the prevention steps below until a healthcare provider or local or state health department says they can return to normal activities.  Stay home except to get medical care You should restrict activities outside your home, except for getting medical care. Do not go to work, school, or public areas, and do not use public transportation or taxis.  Call ahead before visiting your doctor Before your medical appointment, call the healthcare provider and tell them that you have, or are being evaluated for, COVID-19 infection. This will help the healthcare providers office take steps to keep other people from getting infected. Ask your healthcare provider to call the local or state health department.  Monitor your symptoms Seek prompt medical attention if your illness is worsening (e.g., difficulty breathing). Before going to your medical appointment, call the healthcare provider and tell them that you have, or are being evaluated for, COVID-19 infection. Ask your healthcare provider to call the local or state health department.  Wear a facemask You should wear a facemask that covers your nose and mouth when you are in the same room with other people and when you visit a healthcare provider. People who live with or visit you should also wear a facemask while they are in the same room with you.  Separate yourself from other people in your home As much as possible, you should stay in a different room from other people in your home. Also, you should use a separate bathroom, if available.  Avoid sharing household items You should  not share dishes, drinking glasses, cups, eating utensils, towels, bedding, or other items with other people in your home. After using these items, you should wash them thoroughly with soap and water.  Cover your coughs and sneezes Cover your mouth and nose with a tissue when you cough or sneeze, or you can cough or sneeze into your sleeve. Throw used tissues in a lined trash can, and immediately wash your hands with soap and water for at least 20 seconds or use an alcohol-based hand rub.  Wash your [facility] your hands often and thoroughly with soap and water for at least 20 seconds. You can use an alcohol-based hand sanitizer if soap and water are not available and if your hands are not visibly dirty. Avoid touching your eyes, nose, and mouth with unwashed hands.   Prevention Steps for Caregivers and Household Members of Individuals Confirmed to have, or Being Evaluated for, COVID-19 Infection Being Cared for in the Home  If you live with, or provide care at home for, a person confirmed to have, or being evaluated for, COVID-19 infection please follow these guidelines to prevent infection:  Follow healthcare providers instructions Make sure that you understand and can help the patient follow any healthcare provider instructions for all care.  Provide for the patients basic needs You should help the patient with basic needs in the home and provide support for getting groceries, prescriptions, and other personal needs.  Monitor the patients symptoms If they are getting sicker, call his or her medical provider and tell them that the patient has, or is being evaluated for, COVID-19 infection. This will help the healthcare  providers office take steps to keep other people from getting infected. Ask the healthcare provider to call the local or state health department.  Limit the number of people who have contact with the patient If possible, have only one caregiver for the  patient. Other household members should stay in another home or place of residence. If this is not possible, they should stay in another room, or be separated from the patient as much as possible. Use a separate bathroom, if available. Restrict visitors who do not have an essential need to be in the home.  Keep older adults, very young children, and other sick people away from the patient Keep older adults, very young children, and those who have compromised immune systems or chronic health conditions away from the patient. This includes people with chronic heart, lung, or kidney conditions, diabetes, and cancer.  Ensure good ventilation Make sure that shared spaces in the home have good air flow, such as from an air conditioner or an opened window, weather permitting.  Wash your hands often Wash your hands often and thoroughly with soap and water for at least 20 seconds. You can use an alcohol based hand sanitizer if soap and water are not available and if your hands are not visibly dirty. Avoid touching your eyes, nose, and mouth with unwashed hands. Use disposable paper towels to dry your hands. If not available, use dedicated cloth towels and replace them when they become wet.  Wear a facemask and gloves Wear a disposable facemask at all times in the room and gloves when you touch or have contact with the patients blood, body fluids, and/or secretions or excretions, such as sweat, saliva, sputum, nasal mucus, vomit, urine, or feces.  Ensure the mask fits over your nose and mouth tightly, and do not touch it during use. Throw out disposable facemasks and gloves after using them. Do not reuse. Wash your hands immediately after removing your facemask and gloves. If your personal clothing becomes contaminated, carefully remove clothing and launder. Wash your hands after handling contaminated clothing. Place all used disposable facemasks, gloves, and other waste in a lined container before  disposing them with other household waste. Remove gloves and wash your hands immediately after handling these items.  Do not share dishes, glasses, or other household items with the patient Avoid sharing household items. You should not share dishes, drinking glasses, cups, eating utensils, towels, bedding, or other items with a patient who is confirmed to have, or being evaluated for, COVID-19 infection. After the person uses these items, you should wash them thoroughly with soap and water.  Wash laundry thoroughly Immediately remove and wash clothes or bedding that have blood, body fluids, and/or secretions or excretions, such as sweat, saliva, sputum, nasal mucus, vomit, urine, or feces, on them. Wear gloves when handling laundry from the patient. Read and follow directions on labels of laundry or clothing items and detergent. In general, wash and dry with the warmest temperatures recommended on the label.  Clean all areas the individual has used often Clean all touchable surfaces, such as counters, tabletops, doorknobs, bathroom fixtures, toilets, phones, keyboards, tablets, and bedside tables, every day. Also, clean any surfaces that may have blood, body fluids, and/or secretions or excretions on them. Wear gloves when cleaning surfaces the patient has come in contact with. Use a diluted bleach solution (e.g., dilute bleach with 1 part bleach and 10 parts water) or a household disinfectant with a label that says EPA-registered for coronaviruses. To make  a bleach solution at home, add 1 tablespoon of bleach to 1 quart (4 cups) of water. For a larger supply, add  cup of bleach to 1 gallon (16 cups) of water. Read labels of cleaning products and follow recommendations provided on product labels. Labels contain instructions for safe and effective use of the cleaning product including precautions you should take when applying the product, such as wearing gloves or eye protection and making sure you  have good ventilation during use of the product. Remove gloves and wash hands immediately after cleaning.  Monitor yourself for signs and symptoms of illness Caregivers and household members are considered close contacts, should monitor their health, and will be asked to limit movement outside of the home to the extent possible. Follow the monitoring steps for close contacts listed on the symptom monitoring form.   ? If you have additional questions, contact your local health department or call the epidemiologist on call at [phone] (available 24/7). ? This guidance is subject to change. For the most up-to-date guidance from [facility], please refer to their website: [email or web address]

## 2018-10-28 NOTE — ED Notes (Signed)
Peripheral IV discontinued. Catheter intact. No signs of infiltration or redness. Gauze applied to IV site.   Discharge instructions reviewed with patient. Questions fielded by this RN. Patient verbalizes understanding of instructions. Patient discharged home in stable condition per brown. No acute distress noted at time of discharge.    

## 2018-10-29 ENCOUNTER — Other Ambulatory Visit: Payer: Self-pay | Admitting: Obstetrics

## 2018-10-29 DIAGNOSIS — J028 Acute pharyngitis due to other specified organisms: Secondary | ICD-10-CM

## 2018-10-29 DIAGNOSIS — M791 Myalgia, unspecified site: Secondary | ICD-10-CM

## 2018-10-29 MED ORDER — TRAMADOL HCL 50 MG PO TABS: 50.0000 mg | ORAL_TABLET | Freq: Four times a day (QID) | ORAL | 0 refills | Status: DC | PRN

## 2018-11-01 ENCOUNTER — Other Ambulatory Visit: Payer: Self-pay

## 2018-11-01 MED ORDER — FLUCONAZOLE 150 MG PO TABS: ORAL_TABLET | ORAL | 3 refills | Status: DC

## 2018-11-01 NOTE — Progress Notes (Signed)
Recent ABx use. Pt has another yeast infection. Diflucan sent to the pharmacy per protocol.

## 2018-12-31 DIAGNOSIS — F331 Major depressive disorder, recurrent, moderate: Secondary | ICD-10-CM | POA: Diagnosis not present

## 2019-01-23 ENCOUNTER — Other Ambulatory Visit: Payer: Self-pay | Admitting: Obstetrics

## 2019-01-23 MED ORDER — PHENTERMINE HCL 37.5 MG PO CAPS: 37.5000 mg | ORAL_CAPSULE | ORAL | 2 refills | Status: DC

## 2019-01-31 DIAGNOSIS — F331 Major depressive disorder, recurrent, moderate: Secondary | ICD-10-CM | POA: Diagnosis not present

## 2019-02-24 ENCOUNTER — Other Ambulatory Visit (HOSPITAL_COMMUNITY)
Admission: RE | Admit: 2019-02-24 | Discharge: 2019-02-24 | Disposition: A | Discharge status: Home / Self Care | Payer: BLUE CROSS/BLUE SHIELD | Source: Ambulatory Visit | Attending: Obstetrics and Gynecology | Admitting: Obstetrics and Gynecology

## 2019-02-24 ENCOUNTER — Ambulatory Visit (INDEPENDENT_AMBULATORY_CARE_PROVIDER_SITE_OTHER): Payer: BLUE CROSS/BLUE SHIELD

## 2019-02-24 ENCOUNTER — Other Ambulatory Visit: Payer: Self-pay

## 2019-02-24 DIAGNOSIS — B9689 Other specified bacterial agents as the cause of diseases classified elsewhere: Secondary | ICD-10-CM | POA: Diagnosis not present

## 2019-02-24 DIAGNOSIS — N898 Other specified noninflammatory disorders of vagina: Secondary | ICD-10-CM

## 2019-02-24 DIAGNOSIS — N760 Acute vaginitis: Secondary | ICD-10-CM | POA: Diagnosis not present

## 2019-02-24 NOTE — Progress Notes (Signed)
Pt presents for self swab. Pt complains of having vaginal discharge with itching and a little bit of an odor that started about 2 weeks ago. She also states that she noticed a lot of bleeding after having IC 2 days ago. Pt request to have STD testing completed. Pt advised to abstain until results are back.

## 2019-02-24 NOTE — Progress Notes (Signed)
Patient seen and assessed by nursing staff during this encounter. I have reviewed the chart and agree with the documentation and plan.  Yoseph Haile, MD 02/24/2019 4:40 PM    

## 2019-02-28 ENCOUNTER — Other Ambulatory Visit: Payer: Self-pay

## 2019-02-28 LAB — CERVICOVAGINAL ANCILLARY ONLY
Bacterial Vaginitis (gardnerella): POSITIVE — AB
Candida Glabrata: NEGATIVE
Candida Vaginitis: NEGATIVE
Chlamydia: NEGATIVE
Comment: NEGATIVE
Comment: NEGATIVE
Comment: NEGATIVE
Comment: NEGATIVE
Comment: NEGATIVE
Comment: NORMAL
Neisseria Gonorrhea: NEGATIVE
Trichomonas: NEGATIVE

## 2019-02-28 MED ORDER — METRONIDAZOLE 500 MG PO TABS: 500.0000 mg | ORAL_TABLET | Freq: Two times a day (BID) | ORAL | 0 refills | Status: DC

## 2019-02-28 MED ORDER — FLUCONAZOLE 150 MG PO TABS: 150.0000 mg | ORAL_TABLET | Freq: Once | ORAL | 0 refills | Status: AC

## 2019-03-10 ENCOUNTER — Other Ambulatory Visit: Payer: Self-pay

## 2019-03-10 DIAGNOSIS — N898 Other specified noninflammatory disorders of vagina: Secondary | ICD-10-CM

## 2019-03-10 MED ORDER — FLUCONAZOLE 150 MG PO TABS: ORAL_TABLET | ORAL | 0 refills | Status: DC

## 2019-03-10 NOTE — Progress Notes (Signed)
Rx sent per protocol 

## 2019-04-23 DIAGNOSIS — F3341 Major depressive disorder, recurrent, in partial remission: Secondary | ICD-10-CM | POA: Diagnosis not present

## 2019-04-24 ENCOUNTER — Encounter: Payer: Self-pay | Admitting: Emergency Medicine

## 2019-04-24 ENCOUNTER — Emergency Department
Admission: EM | Admit: 2019-04-24 | Discharge: 2019-04-24 | Disposition: A | Discharge status: Home / Self Care | Payer: BLUE CROSS/BLUE SHIELD | Attending: Emergency Medicine | Admitting: Emergency Medicine

## 2019-04-24 ENCOUNTER — Emergency Department: Payer: BLUE CROSS/BLUE SHIELD

## 2019-04-24 ENCOUNTER — Other Ambulatory Visit: Payer: Self-pay

## 2019-04-24 DIAGNOSIS — Z20828 Contact with and (suspected) exposure to other viral communicable diseases: Secondary | ICD-10-CM | POA: Diagnosis not present

## 2019-04-24 DIAGNOSIS — J45909 Unspecified asthma, uncomplicated: Secondary | ICD-10-CM | POA: Diagnosis not present

## 2019-04-24 DIAGNOSIS — N390 Urinary tract infection, site not specified: Secondary | ICD-10-CM | POA: Diagnosis not present

## 2019-04-24 DIAGNOSIS — R000 Tachycardia, unspecified: Secondary | ICD-10-CM | POA: Diagnosis not present

## 2019-04-24 DIAGNOSIS — Z79899 Other long term (current) drug therapy: Secondary | ICD-10-CM | POA: Diagnosis not present

## 2019-04-24 DIAGNOSIS — R42 Dizziness and giddiness: Secondary | ICD-10-CM | POA: Diagnosis not present

## 2019-04-24 LAB — CBC WITH DIFFERENTIAL/PLATELET
Abs Immature Granulocytes: 0.05 10*3/uL (ref 0.00–0.07)
Basophils Absolute: 0 10*3/uL (ref 0.0–0.1)
Basophils Relative: 0 %
Eosinophils Absolute: 0 10*3/uL (ref 0.0–0.5)
Eosinophils Relative: 0 %
HCT: 35 % — ABNORMAL LOW (ref 36.0–46.0)
Hemoglobin: 10.9 g/dL — ABNORMAL LOW (ref 12.0–15.0)
Immature Granulocytes: 0 %
Lymphocytes Relative: 3 %
Lymphs Abs: 0.3 10*3/uL — ABNORMAL LOW (ref 0.7–4.0)
MCH: 22.8 pg — ABNORMAL LOW (ref 26.0–34.0)
MCHC: 31.1 g/dL (ref 30.0–36.0)
MCV: 73.1 fL — ABNORMAL LOW (ref 80.0–100.0)
Monocytes Absolute: 0.4 10*3/uL (ref 0.1–1.0)
Monocytes Relative: 3 %
Neutro Abs: 11.1 10*3/uL — ABNORMAL HIGH (ref 1.7–7.7)
Neutrophils Relative %: 94 %
Platelets: 331 10*3/uL (ref 150–400)
RBC: 4.79 MIL/uL (ref 3.87–5.11)
RDW: 14.1 % (ref 11.5–15.5)
WBC: 11.8 10*3/uL — ABNORMAL HIGH (ref 4.0–10.5)
nRBC: 0 % (ref 0.0–0.2)

## 2019-04-24 LAB — URINALYSIS, COMPLETE (UACMP) WITH MICROSCOPIC
Bilirubin Urine: NEGATIVE
Glucose, UA: NEGATIVE mg/dL
Hgb urine dipstick: NEGATIVE
Ketones, ur: NEGATIVE mg/dL
Nitrite: NEGATIVE
Protein, ur: 100 mg/dL — AB
Specific Gravity, Urine: 1.032 — ABNORMAL HIGH (ref 1.005–1.030)
WBC, UA: >50 WBC/hpf — ABNORMAL HIGH (ref 0–5)
pH: 5 (ref 5.0–8.0)

## 2019-04-24 LAB — URINE DRUG SCREEN, QUALITATIVE (ARMC ONLY)
Amphetamines, Ur Screen: POSITIVE — AB
Barbiturates, Ur Screen: POSITIVE — AB
Benzodiazepine, Ur Scrn: NOT DETECTED
Cannabinoid 50 Ng, Ur ~~LOC~~: NOT DETECTED
Cocaine Metabolite,Ur ~~LOC~~: NOT DETECTED
MDMA (Ecstasy)Ur Screen: NOT DETECTED
Methadone Scn, Ur: NOT DETECTED
Opiate, Ur Screen: NOT DETECTED
Phencyclidine (PCP) Ur S: NOT DETECTED
Tricyclic, Ur Screen: NOT DETECTED

## 2019-04-24 LAB — COMPREHENSIVE METABOLIC PANEL
ALT: 14 U/L (ref 0–44)
AST: 19 U/L (ref 15–41)
Albumin: 4.3 g/dL (ref 3.5–5.0)
Alkaline Phosphatase: 58 U/L (ref 38–126)
Anion gap: 10 (ref 5–15)
BUN: 6 mg/dL (ref 6–20)
CO2: 22 mmol/L (ref 22–32)
Calcium: 8.9 mg/dL (ref 8.9–10.3)
Chloride: 103 mmol/L (ref 98–111)
Creatinine, Ser: 0.67 mg/dL (ref 0.44–1.00)
GFR calc Af Amer: >60 mL/min (ref >60)
GFR calc non Af Amer: >60 mL/min (ref >60)
Glucose, Bld: 91 mg/dL (ref 70–99)
Potassium: 3.5 mmol/L (ref 3.5–5.1)
Sodium: 135 mmol/L (ref 135–145)
Total Bilirubin: 0.9 mg/dL (ref 0.3–1.2)
Total Protein: 8.1 g/dL (ref 6.5–8.1)

## 2019-04-24 LAB — PREGNANCY, URINE: Preg Test, Ur: NEGATIVE

## 2019-04-24 LAB — TSH: TSH: 0.397 u[IU]/mL (ref 0.350–4.500)

## 2019-04-24 LAB — T4, FREE: Free T4: 0.87 ng/dL (ref 0.61–1.12)

## 2019-04-24 LAB — MAGNESIUM: Magnesium: 1.9 mg/dL (ref 1.7–2.4)

## 2019-04-24 LAB — POCT PREGNANCY, URINE: Preg Test, Ur: NEGATIVE

## 2019-04-24 MED ORDER — SODIUM CHLORIDE 0.9 % IV BOLUS
2000.0000 mL | Freq: Once | INTRAVENOUS | Status: AC
  Administered 2019-04-24: 2000 mL via INTRAVENOUS

## 2019-04-24 MED ORDER — CEPHALEXIN 500 MG PO CAPS: 500.0000 mg | ORAL_CAPSULE | Freq: Two times a day (BID) | ORAL | 0 refills | Status: AC

## 2019-04-24 MED ORDER — SODIUM CHLORIDE 0.9 % IV SOLN
1.0000 g | Freq: Once | INTRAVENOUS | Status: AC
  Administered 2019-04-24: 1 g via INTRAVENOUS
  Filled 2019-04-24: qty 10

## 2019-04-24 MED ORDER — KETOROLAC TROMETHAMINE 30 MG/ML IJ SOLN
15.0000 mg | Freq: Once | INTRAMUSCULAR | Status: AC
  Administered 2019-04-24: 15 mg via INTRAVENOUS
  Filled 2019-04-24: qty 1

## 2019-04-24 MED ORDER — ACETAMINOPHEN 500 MG PO TABS
1000.0000 mg | ORAL_TABLET | Freq: Once | ORAL | Status: AC
  Administered 2019-04-24: 1000 mg via ORAL
  Filled 2019-04-24: qty 2

## 2019-04-24 NOTE — Discharge Instructions (Addendum)
Thank you for letting us take care of you in the emergency department today.   Please continue to take any regular, prescribed medications.  Please be sure to stay well-hydrated.  New medications we have prescribed:  - Keflex - antibiotic for urine infection  Please follow up with: - Your primary care doctor to review your ER visit and follow up on your symptoms. You should get your heart rate checked again, especially since you are on medication that can make it be elevated.    At this time, your coronavirus swab results are pending. You should hear your results in 1-2 days if they are positive.   In the meantime, it is important to take precautions in case you are positive. This includes quarantining, wearing a mask, and social distancing.   Continue to take over the counter acetaminophen and ibuprofen as directed on the box to help with fevers as well as aches and pains.   Please return to the ER for any new or worsening symptoms, such as difficulty breathing, vomiting and diarrhea, or chest pain.

## 2019-04-24 NOTE — ED Notes (Signed)
Pt presentation and EKG discussed with EDP, Isaacs; advises patient be roomed at this time.  First Nurse made aware.

## 2019-04-24 NOTE — ED Triage Notes (Signed)
Pt in via POV, reports onset headache, light headedness beginning yesterday at work.  States "I felt like I just needed to lay down."  Pt tachy in the 160's upon arrival."  Pt states, "Oh I take Phentermine."  Reports taking for approximately 3 months now, denies any side similar side effects until now.  Pt clammy.  Other vitals WDL.

## 2019-04-24 NOTE — ED Provider Notes (Signed)
[facility] Emergency Department Provider Note  ____________________________________________   First MD Initiated Contact with Patient 04/24/19 1339     (approximate)  I have reviewed the triage vital signs and the nursing notes.  History  Chief Complaint Tachycardia and Dizziness    HPI Vickie Morales is a 23 y.o. female with a history of anemia, who presents to the emergency department for feeling generally unwell, headache, tachycardia, rhinorrhea, body aches.  Patient states she began to feel generally "off" at work yesterday.  Last night she developed a headache.  Headache is all over, she describes it as feeling like contractions/aching.  8/10 in severity. No radiation, alleviating or aggravating factors. Headache is improved today. She also reports developing some runny nose, as well as body aches.  She states she does work in [employer] and is exposed to many people who do not wear masks appropriately.  On arrival to the emergency department she was noted to be tachycardic into the 160s.  She does state that she is taking phentermine.  She denies any history of arrhythmias.  She denies any recent fevers, cough, trouble breathing, vomiting, diarrhea, dysuria.  She finished her LMP on 12/4.   Past Medical Hx Past Medical History:  Diagnosis Date  . Allergy   . Anemia   . Asthma   . Headache     Problem List Patient Active Problem List   Diagnosis Date Noted  . Tension headache, chronic 04/23/2015    Past Surgical Hx Past Surgical History:  Procedure Laterality Date  . MOUTH SURGERY    . TONSILLECTOMY AND ADENOIDECTOMY Bilateral 2011    Medications Prior to Admission medications   Medication Sig Start Date End Date Taking? Authorizing Provider  chlorzoxazone (PARAFON) 500 MG tablet Take 500 mg by mouth 2 (two) times daily.  04/13/15   [provider]  clotrimazole-betamethasone (LOTRISONE) cream  04/06/15    [provider]  fluconazole (DIFLUCAN) 150 MG tablet Can take additional dose three days later if symptoms persist 11/01/18   Shelly Bombard, MD  fluconazole (DIFLUCAN) 150 MG tablet 1 tab po 03/10/19   Shelly Bombard, MD  fluconazole (DIFLUCAN) 200 MG tablet Take 1 tab po q.o.d x 3 days 03/05/18   Shelly Bombard, MD  ibuprofen (ADVIL,MOTRIN) 200 MG tablet Take 400 mg by mouth every 6 (six) hours as needed.    [provider]  metroNIDAZOLE (FLAGYL) 500 MG tablet Take 1 tablet (500 mg total) by mouth 2 (two) times daily. Patient not taking: Reported on 05/16/2017 02/21/17   Constant, Peggy, MD  metroNIDAZOLE (FLAGYL) 500 MG tablet Take 1 tablet (500 mg total) by mouth 2 (two) times daily. 04/18/18   Shelly Bombard, MD  metroNIDAZOLE (FLAGYL) 500 MG tablet Take 1 tablet (500 mg total) by mouth 2 (two) times daily. 02/28/19   Shelly Bombard, MD  naproxen sodium (ANAPROX) 550 MG tablet Take 550 mg by mouth 2 (two) times daily with a meal.  04/15/15   [provider]  Norethindrone Acetate-Ethinyl Estrad-FE (LOESTRIN 24 FE) 1-20 MG-MCG(24) tablet Take 1 tablet by mouth daily. 02/11/18   Constant, Peggy, MD  phentermine 37.5 MG capsule Take 1 capsule (37.5 mg total) by mouth every morning. 01/23/19   Shelly Bombard, MD  promethazine (PHENERGAN) 12.5 MG tablet 1-2 tablets every 6 hours as needed for headache Patient not taking: Reported on 02/19/2017 04/23/15   Carylon Perches, MD  SUMAtriptan (IMITREX) 100 MG tablet  Take 100 mg by mouth every 2 (two) hours as needed.  04/15/15   [provider]  traMADol (ULTRAM) 50 MG tablet Take 1 tablet (50 mg total) by mouth every 6 (six) hours as needed. 10/29/18   Brock Bad, MD    Allergies Hydrocodone  Family Hx Family History  Problem Relation Age of Onset  . Migraines Mother   . ADD / ADHD Mother   . Depression Mother   . Anxiety disorder Mother   . Alcohol abuse Father   . Migraines Maternal  Grandmother   . Asthma Maternal Grandfather     Social Hx Social History   Tobacco Use  . Smoking status: Never Smoker  . Smokeless tobacco: Never Used  Substance Use Topics  . Alcohol use: No    Alcohol/week: 0.0 standard drinks  . Drug use: No     Review of Systems  Constitutional: Negative for fever, chills. + generalized fatigue, body aches Eyes: Negative for visual changes. ENT: + rhinorrhea Cardiovascular: + tachycardia Respiratory: Negative for shortness of breath. Gastrointestinal: Negative for nausea, vomiting.  Genitourinary: Negative for dysuria. Musculoskeletal: Negative for leg swelling. Skin: Negative for rash. Neurological: + for for headaches.   Physical Exam  Vital Signs: ED Triage Vitals [04/24/19 1259]  Enc Vitals Group     BP (!) 116/55     Pulse Rate (!) 164     Resp 16     Temp 99.7 F (37.6 C)     Temp Source Oral     SpO2 97 %     Weight 165 lb (74.8 kg)     Height 5\' 3"  (1.6 m)     Head Circumference      Peak Flow      Pain Score 8     Pain Loc      Pain Edu?      Excl. in GC?     Constitutional: Alert and oriented.  Head: Normocephalic. Atraumatic. Eyes: Conjunctivae clear. Sclera anicteric. Nose: Clear, non-purulent rhinorrhea.  Mouth/Throat: Wearing mask.  Neck: No stridor.   Cardiovascular: Tachycardic, regular rhythm. Extremities well perfused. Respiratory: Normal respiratory effort.  Lungs CTAB. Gastrointestinal: Soft. Non-tender. Non-distended.  Musculoskeletal: No lower extremity edema. No deformities. Neurologic:  Normal speech and language. No gross focal neurologic deficits are appreciated.  Skin: Skin is warm, dry and intact. No rash noted. Psychiatric: Mood and affect are appropriate for situation.  EKG  Personally reviewed.   Rate: 150 Rhythm: sinus Axis: borderline left Intervals: WNL No evidence of WPW, Brugada, or prolonged QTC Sinus tachycardia, no acute ischemic changes No PR or ST changes No  STEMI    Radiology  CXR: IMPRESSION:  No radiographic evidence of acute cardiopulmonary disease.    Procedures  Procedure(s) performed (including critical care):  Procedures   Initial Impression / Assessment and Plan / ED Course  23 y.o. female who presents to the ED for generalized fatigue, headache, body ache, runny nose.  Noted to be tachycardic in triage 160.  Ddx: dehydration, anemia, electrolyte abnormality, infection, thyroid disease, medication related (patient takes phentermine)  Will obtain labs, urine, imaging, fluids and reassess.  EKG reveals sinus tachycardia - no arrhythmia, no acute ischemic changes, no evidence of pericarditis.  Hemoglobin reveals mild anemia, hemoglobin 10.9, likely in the setting of recently completing her menses, does not require transfusion.  Electrolytes without actionable derangements.  Thyroid studies within normal limits.  Urine suggestive of infection, will opt to treat given her tachycardia.  Will  give IVF, first dose antibiotics here and recheck HR. If improved, plan for discharge with course of outpatient antibiotics.  Advised adequate hydration and follow-up with PCP for heart rate recheck, and to review her medications.  Patient voices understanding and is comfortable with the plan.  She is aware that her COVID swab will be pending at time of discharge, and the need for quarantining/social distancing until it results.   Final Clinical Impression(s) / ED Diagnosis  Final diagnoses:  Sinus tachycardia  Urinary tract infection in female       Note:  This document was prepared using Dragon voice recognition software and may include unintentional dictation errors.   Miguel AschoffMonks, Yuli Lanigan L., MD 04/24/19 [phone]

## 2019-04-24 NOTE — ED Notes (Signed)
Per First Nurse, no available room at this time.  Pt placed in subwait, advised to use call bell to notify us of any changes to condition.  Pt A/Ox4, NAD noted at this time.

## 2019-04-24 NOTE — ED Provider Notes (Signed)
-----------------------------------------   3:57 PM on 04/24/2019 -----------------------------------------  Blood pressure (!) 116/55, pulse (!) 164, temperature 99.7 F (37.6 C), temperature source Oral, resp. rate 16, height 5\' 3"  (1.6 m), weight 74.8 kg, last menstrual period 04/15/2019, SpO2 97 %.  Assuming care from Dr. Joan Mayans.  In short, Vickie Morales is a 23 y.o. female with a chief complaint of Tachycardia and Dizziness .  Refer to the original H&P for additional details.  The current plan of care is to reassess after administration of antibiotics and addition fluids. Plan for discharge home once complete.  Patient completed fluid bolus and reports she feels much better.  She continues to have some mild tachycardia, likely secondary to her phentermine use.  She was also noted to have a heart rate of 145 during prior ED visit.  Patient to follow-up with her PCP and to return to the ED for new or worsening symptoms.  Patient agrees with plan.    Blake Divine, MD 04/24/19 2011

## 2019-04-25 LAB — SARS CORONAVIRUS 2 (TAT 6-24 HRS): SARS Coronavirus 2: NEGATIVE

## 2019-05-20 DIAGNOSIS — Z20822 Contact with and (suspected) exposure to covid-19: Secondary | ICD-10-CM | POA: Diagnosis not present

## 2019-05-20 DIAGNOSIS — R05 Cough: Secondary | ICD-10-CM | POA: Diagnosis not present

## 2019-05-29 ENCOUNTER — Other Ambulatory Visit: Payer: Self-pay

## 2019-05-29 ENCOUNTER — Ambulatory Visit (INDEPENDENT_AMBULATORY_CARE_PROVIDER_SITE_OTHER): Payer: BLUE CROSS/BLUE SHIELD | Admitting: Family Medicine

## 2019-05-29 ENCOUNTER — Encounter: Payer: Self-pay | Admitting: Family Medicine

## 2019-05-29 ENCOUNTER — Other Ambulatory Visit (HOSPITAL_COMMUNITY)
Admission: RE | Admit: 2019-05-29 | Discharge: 2019-05-29 | Disposition: A | Discharge status: Home / Self Care | Payer: BLUE CROSS/BLUE SHIELD | Source: Ambulatory Visit | Attending: Family Medicine | Admitting: Family Medicine

## 2019-05-29 VITALS — BP 111/73 | HR 88 | Ht 63.0 in | Wt 169.0 lb

## 2019-05-29 DIAGNOSIS — Z113 Encounter for screening for infections with a predominantly sexual mode of transmission: Secondary | ICD-10-CM

## 2019-05-29 DIAGNOSIS — Z124 Encounter for screening for malignant neoplasm of cervix: Secondary | ICD-10-CM

## 2019-05-29 DIAGNOSIS — Z23 Encounter for immunization: Secondary | ICD-10-CM | POA: Diagnosis not present

## 2019-05-29 DIAGNOSIS — Z01419 Encounter for gynecological examination (general) (routine) without abnormal findings: Secondary | ICD-10-CM | POA: Diagnosis not present

## 2019-05-29 DIAGNOSIS — Z3009 Encounter for other general counseling and advice on contraception: Secondary | ICD-10-CM | POA: Diagnosis not present

## 2019-05-29 MED ORDER — NORGESTIMATE-ETH ESTRADIOL 0.25-35 MG-MCG PO TABS: 1.0000 | ORAL_TABLET | Freq: Every day | ORAL | 11 refills | Status: DC

## 2019-05-29 NOTE — Progress Notes (Signed)
GYNECOLOGY ANNUAL PREVENTATIVE CARE ENCOUNTER NOTE  Subjective:   Vickie Morales is a 24 y.o. G0P0000 female here for a annual gynecologic exam. Current complaints: would like birth control, was previously on Lo-Lo but it gave her headaches.   Denies abnormal vaginal bleeding, discharge, pelvic pain, problems with intercourse or other gynecologic concerns.    Gynecologic History Patient's last menstrual period was 05/08/2019 (exact date). Contraception: OCP (estrogen/progesterone) Last Pap: n/a. Last mammogram: n/a.  Gardisil: has not received  Obstetric History OB History  Gravida Para Term Preterm AB Living  0 0 0 0 0 0  SAB TAB Ectopic Multiple Live Births  0 0 0 0 0    Past Medical History:  Diagnosis Date  . Allergy   . Anemia   . Asthma   . Headache     Past Surgical History:  Procedure Laterality Date  . MOUTH SURGERY    . TONSILLECTOMY AND ADENOIDECTOMY Bilateral 2011    Current Outpatient Medications on File Prior to Visit  Medication Sig Dispense Refill  . escitalopram (LEXAPRO) 10 MG tablet Take 30 mg by mouth every other day.    . fluconazole (DIFLUCAN) 150 MG tablet Can take additional dose three days later if symptoms persist (Patient not taking: Reported on 04/24/2019) 1 tablet 3  . fluconazole (DIFLUCAN) 150 MG tablet 1 tab po (Patient not taking: Reported on 04/24/2019) 1 tablet 0  . fluconazole (DIFLUCAN) 200 MG tablet Take 1 tab po q.o.d x 3 days (Patient not taking: Reported on 04/24/2019) 3 tablet 1  . metroNIDAZOLE (FLAGYL) 500 MG tablet Take 1 tablet (500 mg total) by mouth 2 (two) times daily. (Patient not taking: Reported on 05/16/2017) 14 tablet 0  . metroNIDAZOLE (FLAGYL) 500 MG tablet Take 1 tablet (500 mg total) by mouth 2 (two) times daily. (Patient not taking: Reported on 04/24/2019) 14 tablet 0  . metroNIDAZOLE (FLAGYL) 500 MG tablet Take 1 tablet (500 mg total) by mouth 2 (two) times daily. (Patient not taking: Reported on 04/24/2019) 14  tablet 0  . Norethindrone Acetate-Ethinyl Estrad-FE (LOESTRIN 24 FE) 1-20 MG-MCG(24) tablet Take 1 tablet by mouth daily. (Patient not taking: Reported on 04/24/2019) 3 Package 4  . phentermine 37.5 MG capsule Take 1 capsule (37.5 mg total) by mouth every morning. 30 capsule 2  . promethazine (PHENERGAN) 12.5 MG tablet 1-2 tablets every 6 hours as needed for headache (Patient not taking: Reported on 02/19/2017) 30 tablet 3  . traMADol (ULTRAM) 50 MG tablet Take 1 tablet (50 mg total) by mouth every 6 (six) hours as needed. (Patient not taking: Reported on 04/24/2019) 30 tablet 0   No current facility-administered medications on file prior to visit.    Allergies  Allergen Reactions  . Hydrocodone Swelling    Social History   Socioeconomic History  . Marital status: Single    Spouse name: Not on file  . Number of children: Not on file  . Years of education: Not on file  . Highest education level: Not on file  Occupational History  . Not on file  Tobacco Use  . Smoking status: Never Smoker  . Smokeless tobacco: Never Used  Substance and Sexual Activity  . Alcohol use: No    Alcohol/week: 0.0 standard drinks  . Drug use: No  . Sexual activity: Yes    Partners: Male    Birth control/protection: None  Other Topics Concern  . Not on file  Social History Narrative   Anael is in twelfth  grade at [employer]. She is doing well.    Living with her mother. She has three adult aged sisters that do not live in the home.    Dad was hospitalized for alcohol abuse. Mother has Attention Deficit Disorder.   HC: 56.2 cm   Social Determinants of Health   Financial Resource Strain:   . Difficulty of Paying Living Expenses: Not on file  Food Insecurity:   . Worried About Programme researcher, broadcasting/film/video in the Last Year: Not on file  . Ran Out of Food in the Last Year: Not on file  Transportation Needs:   . Lack of Transportation (Medical): Not on file  . Lack of Transportation  (Non-Medical): Not on file  Physical Activity:   . Days of Exercise per Week: Not on file  . Minutes of Exercise per Session: Not on file  Stress:   . Feeling of Stress : Not on file  Social Connections:   . Frequency of Communication with Friends and Family: Not on file  . Frequency of Social Gatherings with Friends and Family: Not on file  . Attends Religious Services: Not on file  . Active Member of Clubs or Organizations: Not on file  . Attends Banker Meetings: Not on file  . Marital Status: Not on file  Intimate Partner Violence:   . Fear of Current or Ex-Partner: Not on file  . Emotionally Abused: Not on file  . Physically Abused: Not on file  . Sexually Abused: Not on file    Family History  Problem Relation Age of Onset  . Migraines Mother   . ADD / ADHD Mother   . Depression Mother   . Anxiety disorder Mother   . Alcohol abuse Father   . Migraines Maternal Grandmother   . Asthma Maternal Grandfather     Diet: varied Exercise: reports regular  The following portions of the patient's history were reviewed and updated as appropriate: allergies, current medications, past family history, past medical history, past social history, past surgical history and problem list.  Review of Systems Pertinent items are noted in HPI.   Objective:  BP 111/73   Pulse 88   Ht 5\' 3"  (1.6 m)   Wt 169 lb (76.7 kg)   LMP 05/08/2019 (Exact Date)   BMI 29.94 kg/m  CONSTITUTIONAL: Well-developed, well-nourished female in no acute distress.  HENT:  Normocephalic, atraumatic, External right and left ear normal. Oropharynx is clear and moist EYES: Conjunctivae and EOM are normal. Pupils are equal, round, and reactive to light. No scleral icterus.  NECK: Normal range of motion, supple, no masses.  Normal thyroid.  SKIN: Skin is warm and dry. No rash noted. Not diaphoretic. No erythema. No pallor. NEUROLOGIC: Alert and oriented to person, place, and time. Normal reflexes,  muscle tone coordination. No cranial nerve deficit noted. PSYCHIATRIC: Normal mood and affect. Normal behavior. Normal judgment and thought content. CARDIOVASCULAR: Normal heart rate noted, regular rhythm RESPIRATORY: Clear to auscultation bilaterally. Effort and breath sounds normal, no problems with respiration noted. BREASTS: Symmetric in size. Declines breast exam today ABDOMEN: Soft, normal bowel sounds, no distention noted.  No tenderness, rebound or guarding.  PELVIC: Normal appearing external genitalia; normal appearing vaginal mucosa and cervix.  No abnormal discharge noted.  Pap smear obtained.  Normal uterine size, no other palpable masses, no uterine or adnexal tenderness. MUSCULOSKELETAL: Normal range of motion. No tenderness.  No cyanosis, clubbing, or edema.  2+ distal pulses.  Exam  done with chaperone present.  Assessment and Plan:   1. Encounter for annual routine gynecological examination - Continue routine preventative care  2. Screening examination for STD (sexually transmitted disease) - Hepatitis B surface antigen - Hepatitis C antibody - HIV antibody - RPR - Cervicovaginal ancillary only( [place])  3. Screening for cervical cancer - Cytology - PAP( [place])  4. Birth control counseling - Start Sprintec  5. Need for vaccination - Gardasil given in office today, patient provided information  Will follow up results of pap smear/STI screen and manage accordingly. Encouraged improvement in diet and exercise.  Flu vaccine declined today Gardisil given  Routine preventative health maintenance measures emphasized. Please refer to After Visit Summary for other counseling recommendations.   Total face-to-face time with patient: 15 minutes. Over 50% of encounter was spent on counseling and coordination of care.  Merilyn Baba, DO OB Fellow, [facility] 05/29/2019 5:17 PM

## 2019-05-29 NOTE — Patient Instructions (Addendum)
Pap Test Why am I having this test? A Pap test, also called a Pap smear, is a screening test to check for signs of:  Cancer of the vagina, cervix, and uterus. The cervix is the lower part of the uterus that opens into the vagina.  Infection.  Changes that may be a sign that cancer is developing (precancerous changes). Women need this test on a regular basis. In general, you should have a Pap test every 3 years until you reach menopause or age 22. Women aged 30-60 may choose to have their Pap test done at the same time as an HPV (human papillomavirus) test every 5 years (instead of every 3 years). Your health care provider may recommend having Pap tests more or less often depending on your medical conditions and past Pap test results. What kind of sample is taken?  Your health care provider will collect a sample of cells from the surface of your cervix. This will be done using a small cotton swab, plastic spatula, or brush. This sample is often collected during a pelvic exam, when you are lying on your back on an exam table with feet in footrests (stirrups). In some cases, fluids (secretions) from the cervix or vagina may also be collected. How do I prepare for this test?  Be aware of where you are in your menstrual cycle. If you are menstruating on the day of the test, you may be asked to reschedule.  You may need to reschedule if you have a known vaginal infection on the day of the test.  Follow instructions from your health care provider about: ? Changing or stopping your regular medicines. Some medicines can cause abnormal test results, such as digitalis and tetracycline. ? Avoiding douching or taking a bath the day before or the day of the test. Tell a health care provider about:  Any allergies you have.  All medicines you are taking, including vitamins, herbs, eye drops, creams, and over-the-counter medicines.  Any blood disorders you have.  Any surgeries you have had.  Any  medical conditions you have.  Whether you are pregnant or may be pregnant. How are the results reported? Your test results will be reported as either abnormal or normal. A false-positive result can occur. A false positive is incorrect because it means that a condition is present when it is not. A false-negative result can occur. A false negative is incorrect because it means that a condition is not present when it is. What do the results mean? A normal test result means that you do not have signs of cancer of the vagina, cervix, or uterus. An abnormal result may mean that you have:  Cancer. A Pap test by itself is not enough to diagnose cancer. You will have more tests done in this case.  Precancerous changes in your vagina, cervix, or uterus.  Inflammation of the cervix.  An STD (sexually transmitted disease).  A fungal infection.  A parasite infection. Talk with your health care provider about what your results mean. Questions to ask your health care provider Ask your health care provider, or the department that is doing the test:  When will my results be ready?  How will I get my results?  What are my treatment options?  What other tests do I need?  What are my next steps? Summary  In general, women should have a Pap test every 3 years until they reach menopause or age 22.  Your health care provider will collect a   sample of cells from the surface of your cervix. This will be done using a small cotton swab, plastic spatula, or brush.  In some cases, fluids (secretions) from the cervix or vagina may also be collected. This information is not intended to replace advice given to you by your health care provider. Make sure you discuss any questions you have with your health care provider. Document Revised: 01/08/2017 Document Reviewed: 01/08/2017 Elsevier Patient Education  2020 Elsevier Inc.  Human Papillomavirus Quadrivalent Vaccine suspension for injection What is  this medicine? HUMAN PAPILLOMAVIRUS VACCINE (HYOO muhn pap uh LOH muh vahy ruhs vak SEEN) is a vaccine. It is used to prevent infections of four types of the human papillomavirus. In women, the vaccine may lower your risk of getting cervical, vaginal, vulvar, or anal cancer and genital warts. In men, the vaccine may lower your risk of getting genital warts and anal cancer. You cannot get these diseases from the vaccine. This vaccine does not treat these diseases. This medicine may be used for other purposes; ask your health care provider or pharmacist if you have questions. COMMON BRAND NAME(S): Gardasil What should I tell my health care provider before I take this medicine? They need to know if you have any of these conditions:  fever or infection  hemophilia  HIV infection or AIDS  immune system problems  low platelet count  an unusual reaction to Human Papillomavirus Vaccine, yeast, other medicines, foods, dyes, or preservatives  pregnant or trying to get pregnant  breast-feeding How should I use this medicine? This vaccine is for injection in a muscle on your upper arm or thigh. It is given by a health care professional. You will be observed for 15 minutes after each dose. Sometimes, fainting happens after the vaccine is given. You may be asked to sit or lie down during the 15 minutes. Three doses are given. The second dose is given 2 months after the first dose. The last dose is given 4 months after the second dose. A copy of a Vaccine Information Statement will be given before each vaccination. Read this sheet carefully each time. The sheet may change frequently. Talk to your pediatrician regarding the use of this medicine in children. While this drug may be prescribed for children as young as 9 years of age for selected conditions, precautions do apply. Overdosage: If you think you have taken too much of this medicine contact a poison control center or emergency room at once. NOTE:  This medicine is only for you. Do not share this medicine with others. What if I miss a dose? All 3 doses of the vaccine should be given within 6 months. Remember to keep appointments for follow-up doses. Your health care provider will tell you when to return for the next vaccine. Ask your health care professional for advice if you are unable to keep an appointment or miss a scheduled dose. What may interact with this medicine?  other vaccines This list may not describe all possible interactions. Give your health care provider a list of all the medicines, herbs, non-prescription drugs, or dietary supplements you use. Also tell them if you smoke, drink alcohol, or use illegal drugs. Some items may interact with your medicine. What should I watch for while using this medicine? This vaccine may not fully protect everyone. Continue to have regular pelvic exams and cervical or anal cancer screenings as directed by your doctor. The Human Papillomavirus is a sexually transmitted disease. It can be passed by any   kind of sexual activity that involves genital contact. The vaccine works best when given before you have any contact with the virus. Many people who have the virus do not have any signs or symptoms. Tell your doctor or health care professional if you have any reaction or unusual symptom after getting the vaccine. What side effects may I notice from receiving this medicine? Side effects that you should report to your doctor or health care professional as soon as possible:  allergic reactions like skin rash, itching or hives, swelling of the face, lips, or tongue  breathing problems  feeling faint or lightheaded, falls Side effects that usually do not require medical attention (report to your doctor or health care professional if they continue or are bothersome):  cough  dizziness  fever  headache  nausea  redness, warmth, swelling, pain, or itching at site where injected This list may  not describe all possible side effects. Call your doctor for medical advice about side effects. You may report side effects to FDA at 1-800-FDA-1088. Where should I keep my medicine? This drug is given in a hospital or clinic and will not be stored at home. NOTE: This sheet is a summary. It may not cover all possible information. If you have questions about this medicine, talk to your doctor, pharmacist, or health care provider.  2020 Elsevier/Gold Standard (2013-06-23 13:14:33)

## 2019-05-29 NOTE — Progress Notes (Signed)
Patient presents for Annual Exam today.  LMP: 05/08/19 periods last 7 days with heavy flow.   Contraception: Would like to discuss . Pt denies any unprotected intercourse in the last 14 days.   STD Screening: Full Panel   Family Hx of Breast Cancer: cousin on mom side   CC: None

## 2019-05-30 LAB — CYTOLOGY - PAP: Diagnosis: NEGATIVE

## 2019-05-30 LAB — CERVICOVAGINAL ANCILLARY ONLY
Bacterial Vaginitis (gardnerella): NEGATIVE
Candida Glabrata: NEGATIVE
Candida Vaginitis: POSITIVE — AB
Chlamydia: NEGATIVE
Comment: NEGATIVE
Comment: NEGATIVE
Comment: NEGATIVE
Comment: NEGATIVE
Comment: NEGATIVE
Comment: NORMAL
Neisseria Gonorrhea: NEGATIVE
Trichomonas: NEGATIVE

## 2019-05-31 LAB — RPR, QUANT+TP ABS (REFLEX)
Rapid Plasma Reagin, Quant: 1:1 {titer} — ABNORMAL HIGH
T Pallidum Abs: NONREACTIVE

## 2019-05-31 LAB — HEPATITIS B SURFACE ANTIGEN: Hepatitis B Surface Ag: NEGATIVE

## 2019-05-31 LAB — HIV ANTIBODY (ROUTINE TESTING W REFLEX): HIV Screen 4th Generation wRfx: NONREACTIVE

## 2019-05-31 LAB — RPR: RPR Ser Ql: REACTIVE — AB

## 2019-05-31 LAB — HEPATITIS C ANTIBODY: Hep C Virus Ab: <0.1 s/co ratio (ref 0.0–0.9)

## 2019-07-28 ENCOUNTER — Ambulatory Visit: Payer: BLUE CROSS/BLUE SHIELD

## 2019-08-06 ENCOUNTER — Other Ambulatory Visit: Payer: Self-pay

## 2019-08-06 ENCOUNTER — Ambulatory Visit (INDEPENDENT_AMBULATORY_CARE_PROVIDER_SITE_OTHER): Payer: BLUE CROSS/BLUE SHIELD | Admitting: *Deleted

## 2019-08-06 DIAGNOSIS — Z23 Encounter for immunization: Secondary | ICD-10-CM | POA: Diagnosis not present

## 2019-08-06 NOTE — Progress Notes (Signed)
Patient seen and assessed by nursing staff during this encounter. I have reviewed the chart and agree with the documentation and plan.  Jaynie Collins, MD 08/06/2019 5:33 PM

## 2019-08-06 NOTE — Progress Notes (Signed)
Pt is in office for 2nd Gardasil injection.  Injection given, pt tolerated well. Pt advised to RTO in 4 months for final injection.  Pt has no other concerns today.

## 2019-08-25 ENCOUNTER — Telehealth: Payer: Self-pay

## 2019-08-25 MED ORDER — METRONIDAZOLE 500 MG PO TABS: 500.0000 mg | ORAL_TABLET | Freq: Two times a day (BID) | ORAL | 0 refills | Status: DC

## 2019-08-25 MED ORDER — FLUCONAZOLE 150 MG PO TABS: 150.0000 mg | ORAL_TABLET | Freq: Once | ORAL | 0 refills | Status: AC

## 2019-08-25 NOTE — Telephone Encounter (Signed)
Patient called complaining of vaginal discharge and odor, request flagyl, sent per protocol.

## 2019-11-11 ENCOUNTER — Other Ambulatory Visit: Payer: Self-pay

## 2019-11-11 ENCOUNTER — Ambulatory Visit (INDEPENDENT_AMBULATORY_CARE_PROVIDER_SITE_OTHER): Payer: BLUE CROSS/BLUE SHIELD

## 2019-11-11 ENCOUNTER — Other Ambulatory Visit (HOSPITAL_COMMUNITY)
Admission: RE | Admit: 2019-11-11 | Discharge: 2019-11-11 | Disposition: A | Discharge status: Home / Self Care | Payer: BLUE CROSS/BLUE SHIELD | Source: Ambulatory Visit | Attending: Obstetrics and Gynecology | Admitting: Obstetrics and Gynecology

## 2019-11-11 DIAGNOSIS — N898 Other specified noninflammatory disorders of vagina: Secondary | ICD-10-CM | POA: Insufficient documentation

## 2019-11-11 NOTE — Progress Notes (Signed)
Patient was assessed and managed by nursing staff during this encounter. I have reviewed the chart and agree with the documentation and plan. I have also made any necessary editorial changes.  Warden Fillers, MD 11/11/2019 4:28 PM

## 2019-11-11 NOTE — Progress Notes (Signed)
SUBJECTIVE:  24 y.o. female complains of [place] vaginal discharge x 2 days. Denies abnormal vaginal bleeding or significant pelvic pain or fever. No UTI symptoms. Denies history of known exposure to STD.  OBJECTIVE:  She appears well, afebrile. Urine dipstick: not done.  ASSESSMENT:  Vaginal Discharge  Vaginal Irritation    PLAN:  GC, chlamydia, trichomonas, BVAG, CVAG probe sent to lab. Treatment: To be determined once lab results are received ROV prn if symptoms persist or worsen.

## 2019-11-12 LAB — CERVICOVAGINAL ANCILLARY ONLY
Bacterial Vaginitis (gardnerella): NEGATIVE
Candida Glabrata: NEGATIVE
Candida Vaginitis: NEGATIVE
Chlamydia: NEGATIVE
Comment: NEGATIVE
Comment: NEGATIVE
Comment: NEGATIVE
Comment: NEGATIVE
Comment: NEGATIVE
Comment: NORMAL
Neisseria Gonorrhea: NEGATIVE
Trichomonas: NEGATIVE

## 2019-11-14 ENCOUNTER — Other Ambulatory Visit: Payer: Self-pay

## 2019-11-14 ENCOUNTER — Ambulatory Visit (INDEPENDENT_AMBULATORY_CARE_PROVIDER_SITE_OTHER): Payer: BLUE CROSS/BLUE SHIELD | Admitting: Obstetrics

## 2019-11-14 ENCOUNTER — Encounter: Payer: Self-pay | Admitting: Obstetrics

## 2019-11-14 ENCOUNTER — Other Ambulatory Visit (HOSPITAL_COMMUNITY)
Admission: RE | Admit: 2019-11-14 | Discharge: 2019-11-14 | Disposition: A | Discharge status: Home / Self Care | Payer: BLUE CROSS/BLUE SHIELD | Source: Ambulatory Visit | Attending: Obstetrics | Admitting: Obstetrics

## 2019-11-14 VITALS — BP 111/70 | HR 85 | Wt 180.0 lb

## 2019-11-14 DIAGNOSIS — N898 Other specified noninflammatory disorders of vagina: Secondary | ICD-10-CM | POA: Insufficient documentation

## 2019-11-14 DIAGNOSIS — Z202 Contact with and (suspected) exposure to infections with a predominantly sexual mode of transmission: Secondary | ICD-10-CM | POA: Insufficient documentation

## 2019-11-14 MED ORDER — METRONIDAZOLE 500 MG PO TABS: 500.0000 mg | ORAL_TABLET | Freq: Two times a day (BID) | ORAL | 2 refills | Status: DC

## 2019-11-14 MED ORDER — DOXYCYCLINE HYCLATE 100 MG PO CAPS: 100.0000 mg | ORAL_CAPSULE | Freq: Two times a day (BID) | ORAL | 0 refills | Status: DC

## 2019-11-14 NOTE — Progress Notes (Signed)
Vaginal swab done on 11/10/19  Results on 11/11/19 was negative Vagina feels dry No OTC meds

## 2019-11-14 NOTE — Progress Notes (Signed)
Patient ID: JALESIA Vickie Morales, female   DOB: 1996/03/09, 24 y.o.   MRN: [id number]    HPI ELVIN BANKER is a 24 y.o. female.  Complains of vaginal irritation.  Self-swab done on 11-10-2019, and was negative.  She states that her partner was treated for NGU and Trichomonas with Azithromycin and Flagyl. HPI  Past Medical History:  Diagnosis Date  . Allergy   . Anemia   . Asthma   . Headache     Past Surgical History:  Procedure Laterality Date  . MOUTH SURGERY    . TONSILLECTOMY AND ADENOIDECTOMY Bilateral 2011    Family History  Problem Relation Age of Onset  . Migraines Mother   . ADD / ADHD Mother   . Depression Mother   . Anxiety disorder Mother   . Alcohol abuse Father   . Migraines Maternal Grandmother   . Asthma Maternal Grandfather     Social History Social History   Tobacco Use  . Smoking status: Never Smoker  . Smokeless tobacco: Never Used  Vaping Use  . Vaping Use: Never used  Substance Use Topics  . Alcohol use: No    Alcohol/week: 0.0 standard drinks  . Drug use: No    Allergies  Allergen Reactions  . Hydrocodone Swelling    Current Outpatient Medications  Medication Sig Dispense Refill  . doxycycline (VIBRAMYCIN) 100 MG capsule Take 1 capsule (100 mg total) by mouth 2 (two) times daily. 14 capsule 0  . escitalopram (LEXAPRO) 10 MG tablet Take 30 mg by mouth every other day.    . metroNIDAZOLE (FLAGYL) 500 MG tablet Take 1 tablet (500 mg total) by mouth 2 (two) times daily. 14 tablet 0  . metroNIDAZOLE (FLAGYL) 500 MG tablet Take 1 tablet (500 mg total) by mouth 2 (two) times daily. 14 tablet 2  . norgestimate-ethinyl estradiol (ORTHO-CYCLEN) 0.25-35 MG-MCG tablet Take 1 tablet by mouth daily. 1 Package 11  . phentermine 37.5 MG capsule Take 1 capsule (37.5 mg total) by mouth every morning. 30 capsule 2   No current facility-administered medications for this visit.    Review of Systems Review of Systems Constitutional: negative for fatigue  and weight loss Respiratory: negative for cough and wheezing Cardiovascular: negative for chest pain, fatigue and palpitations Gastrointestinal: negative for abdominal pain and change in bowel habits Genitourinary:positive for vaginal irritation Integument/breast: negative for nipple discharge Musculoskeletal:negative for myalgias Neurological: negative for gait problems and tremors Behavioral/Psych: negative for abusive relationship, depression Endocrine: negative for temperature intolerance      Blood pressure 111/70, pulse 85, weight 180 lb (81.6 kg).  Physical Exam Physical Exam General:   alert and no distress  Skin:   no rash or abnormalities  Lungs:   clear to auscultation bilaterally  Heart:   regular rate and rhythm, S1, S2 normal, no murmur, click, rub or gallop  Breasts:   normal without suspicious masses, skin or nipple changes or axillary nodes  Abdomen:  normal findings: no organomegaly, soft, non-tender and no hernia  Pelvis:  External genitalia: normal general appearance Urinary system: urethral meatus normal and bladder without fullness, nontender Vaginal: normal without tenderness, induration or masses Cervix: normal appearance Adnexa: normal bimanual exam Uterus: anteverted and non-tender, normal size    50% of 15 min visit spent on counseling and coordination of care.   Data Reviewed Wet Prep Cultures  Assessment       1. Vaginal discharge Rx: - Cervicovaginal ancillary only  2. Chlamydia contact, untreated Rx: -  doxycycline (VIBRAMYCIN) 100 MG capsule; Take 1 capsule (100 mg total) by mouth 2 (two) times daily.  Dispense: 14 capsule; Refill: 0 - Cervicovaginal ancillary only  3. Trichomonas contact, untreated Rx: - metroNIDAZOLE (FLAGYL) 500 MG tablet; Take 1 tablet (500 mg total) by mouth 2 (two) times daily.  Dispense: 14 tablet; Refill: 2 - Cervicovaginal ancillary only  Plan   Follow up prn  Meds ordered this encounter  Medications  .  doxycycline (VIBRAMYCIN) 100 MG capsule    Sig: Take 1 capsule (100 mg total) by mouth 2 (two) times daily.    Dispense:  14 capsule    Refill:  0  . metroNIDAZOLE (FLAGYL) 500 MG tablet    Sig: Take 1 tablet (500 mg total) by mouth 2 (two) times daily.    Dispense:  14 tablet    Refill:  2     Brock Bad, MD 11/14/2019 10:27 AM

## 2019-11-18 LAB — CERVICOVAGINAL ANCILLARY ONLY
Bacterial Vaginitis (gardnerella): NEGATIVE
Candida Glabrata: NEGATIVE
Candida Vaginitis: NEGATIVE
Chlamydia: NEGATIVE
Comment: NEGATIVE
Comment: NEGATIVE
Comment: NEGATIVE
Comment: NEGATIVE
Comment: NEGATIVE
Comment: NORMAL
Neisseria Gonorrhea: NEGATIVE
Trichomonas: NEGATIVE

## 2019-11-24 ENCOUNTER — Other Ambulatory Visit: Payer: Self-pay

## 2019-11-24 MED ORDER — FLUCONAZOLE 150 MG PO TABS: 150.0000 mg | ORAL_TABLET | Freq: Once | ORAL | 0 refills | Status: AC

## 2019-11-24 NOTE — Progress Notes (Signed)
Pt c/o vaginal itching after ABx use

## 2019-11-26 ENCOUNTER — Other Ambulatory Visit: Payer: Self-pay

## 2019-11-26 ENCOUNTER — Ambulatory Visit (INDEPENDENT_AMBULATORY_CARE_PROVIDER_SITE_OTHER): Payer: BLUE CROSS/BLUE SHIELD

## 2019-11-26 VITALS — BP 121/79 | HR 80 | Wt 180.0 lb

## 2019-11-26 DIAGNOSIS — Z23 Encounter for immunization: Secondary | ICD-10-CM | POA: Diagnosis not present

## 2019-11-26 DIAGNOSIS — E6609 Other obesity due to excess calories: Secondary | ICD-10-CM | POA: Diagnosis not present

## 2019-11-26 DIAGNOSIS — N62 Hypertrophy of breast: Secondary | ICD-10-CM | POA: Diagnosis not present

## 2019-11-26 DIAGNOSIS — M546 Pain in thoracic spine: Secondary | ICD-10-CM | POA: Diagnosis not present

## 2019-11-26 NOTE — Progress Notes (Signed)
Pt is here for 3rd Gardasil injection, she is on time for injection. Injection given in R deltoid without difficulty.

## 2019-12-21 DIAGNOSIS — N390 Urinary tract infection, site not specified: Secondary | ICD-10-CM | POA: Diagnosis not present

## 2020-01-22 ENCOUNTER — Encounter: Payer: Self-pay | Admitting: Plastic Surgery

## 2020-01-22 ENCOUNTER — Other Ambulatory Visit: Payer: Self-pay

## 2020-01-22 ENCOUNTER — Ambulatory Visit (INDEPENDENT_AMBULATORY_CARE_PROVIDER_SITE_OTHER): Payer: BLUE CROSS/BLUE SHIELD | Admitting: Plastic Surgery

## 2020-01-22 VITALS — BP 96/66 | HR 75 | Temp 98.7°F | Ht 64.0 in | Wt 189.6 lb

## 2020-01-22 DIAGNOSIS — N62 Hypertrophy of breast: Secondary | ICD-10-CM

## 2020-01-22 DIAGNOSIS — M546 Pain in thoracic spine: Secondary | ICD-10-CM | POA: Diagnosis not present

## 2020-01-22 DIAGNOSIS — M4004 Postural kyphosis, thoracic region: Secondary | ICD-10-CM

## 2020-01-22 DIAGNOSIS — M545 Low back pain, unspecified: Secondary | ICD-10-CM

## 2020-01-22 NOTE — Progress Notes (Signed)
Referring Provider Deatra James, MD [phone] [address] [place],  [place] [address]   CC:  Chief Complaint  Patient presents with  . Advice Only      Vickie Morales is an 24 y.o. female.  HPI: Patient presents to discuss breast reduction.  She is had years of back pain, neck pain and shoulder grooving related to her large breasts.  This is been refractory to over-the-counter medications, warm packs, cold packs and supportive bras.  She is currently a triple D and wants to be a bit smaller.  There is no family history of breast cancer.  She does report rashes beneath her breast that have been refractory to prescription medications.  She does not smoke and is not a diabetic.  Allergies  Allergen Reactions  . Hydrocodone Swelling    Outpatient Encounter Medications as of 01/22/2020  Medication Sig  . escitalopram (LEXAPRO) 10 MG tablet Take 30 mg by mouth every other day.  . norgestimate-ethinyl estradiol (ORTHO-CYCLEN) 0.25-35 MG-MCG tablet Take 1 tablet by mouth daily.  . [DISCONTINUED] doxycycline (VIBRAMYCIN) 100 MG capsule Take 1 capsule (100 mg total) by mouth 2 (two) times daily.  . [DISCONTINUED] metroNIDAZOLE (FLAGYL) 500 MG tablet Take 1 tablet (500 mg total) by mouth 2 (two) times daily.  . [DISCONTINUED] metroNIDAZOLE (FLAGYL) 500 MG tablet Take 1 tablet (500 mg total) by mouth 2 (two) times daily.  . [DISCONTINUED] phentermine 37.5 MG capsule Take 1 capsule (37.5 mg total) by mouth every morning.   No facility-administered encounter medications on file as of 01/22/2020.     Past Medical History:  Diagnosis Date  . Allergy   . Anemia   . Asthma   . Headache     Past Surgical History:  Procedure Laterality Date  . MOUTH SURGERY    . TONSILLECTOMY AND ADENOIDECTOMY Bilateral 2011    Family History  Problem Relation Age of Onset  . Migraines Mother   . ADD / ADHD Mother   . Depression Mother   . Anxiety disorder Mother   . Alcohol abuse Father   .  Migraines Maternal Grandmother   . Asthma Maternal Grandfather     Social History   Social History Narrative   Vickie Morales is in twelfth grade at [employer]. She is doing well.    Living with her mother. She has three adult aged sisters that do not live in the home.    Dad was hospitalized for alcohol abuse. Mother has Attention Deficit Disorder.   HC: 56.2 cm     Review of Systems General: Denies fevers, chills, weight loss CV: Denies chest pain, shortness of breath, palpitations  Physical Exam Vitals with BMI 01/22/2020 11/26/2019 11/14/2019  Height 5\' 4"  - -  Weight 189 lbs 10 oz 180 lbs 180 lbs  BMI 32.53 - -  Systolic 96 121 111  Diastolic 66 79 70  Pulse 75 80 85    General:  No acute distress,  Alert and oriented, Non-Toxic, Normal speech and affect Breast: She has grade 2 ptosis.  Sternal notch to nipple is 33 on the right and 32 on the left.  Nipple to fold is 17 on the right and 16 on the left.  I do not see any obvious scars or masses.  Assessment/Plan The patient has bilateral symptomatic macromastia.  She is a good candidate for a breast reduction.  She is interested in pursuing surgical treatment.  She has tried supportive garments and fitted  bras with no relief.  The details of breast reduction surgery were discussed.  I explained the procedure in detail along the with the expected scars.  The risks were discussed in detail and include bleeding, infection, damage to surrounding structures, need for additional procedures, nipple loss, change in nipple sensation, persistent pain, contour irregularities and asymmetries.  I explained that breast feeding is often not possible after breast reduction surgery.  We discussed the expected postoperative course with an overall recovery period of about 1 month.  She demonstrated full understanding of all risks.  We discussed her personal risk factors.  I anticipate approximately 600g of tissue removed from each  side.   [name] 01/22/2020, 2:54 PM

## 2020-02-03 ENCOUNTER — Telehealth: Payer: Self-pay | Admitting: Plastic Surgery

## 2020-02-03 NOTE — Telephone Encounter (Signed)
Called patient, call was answered, but no one responded. If patient calls back, please forward to [facility]. Patient will need at least 6 weeks of PT for insurance to review surgery request, as well as weight management support to reduce BMI. Our office will put in the referrals for both.

## 2020-02-08 ENCOUNTER — Ambulatory Visit
Admission: EM | Admit: 2020-02-08 | Discharge: 2020-02-08 | Disposition: A | Discharge status: Home / Self Care | Payer: BLUE CROSS/BLUE SHIELD | Attending: Emergency Medicine | Admitting: Emergency Medicine

## 2020-02-08 ENCOUNTER — Other Ambulatory Visit: Payer: Self-pay

## 2020-02-08 DIAGNOSIS — R300 Dysuria: Secondary | ICD-10-CM

## 2020-02-08 LAB — POCT URINALYSIS DIP (MANUAL ENTRY)
Bilirubin, UA: NEGATIVE
Blood, UA: NEGATIVE
Glucose, UA: NEGATIVE mg/dL
Ketones, POC UA: NEGATIVE mg/dL
Leukocytes, UA: NEGATIVE
Nitrite, UA: NEGATIVE
Protein Ur, POC: NEGATIVE mg/dL
Spec Grav, UA: 1.025 (ref 1.010–1.025)
Urobilinogen, UA: 0.2 E.U./dL
pH, UA: 6.5 (ref 5.0–8.0)

## 2020-02-08 LAB — POCT URINE PREGNANCY: Preg Test, Ur: NEGATIVE

## 2020-02-08 NOTE — ED Triage Notes (Signed)
Pt c/o urine odor, frequency, dysuria for 4 days. States she was seen for same symptoms one month ago.

## 2020-02-08 NOTE — Discharge Instructions (Signed)
Push fluids, keep track of symptoms & diet. Follow up with PCP as you may need referral to specialist if this continues.

## 2020-02-08 NOTE — ED Provider Notes (Signed)
[facility]    CSN: [id number] Arrival date & time: 02/08/20  1219      History   Chief Complaint Chief Complaint  Patient presents with  . Urinary Tract Infection    HPI Vickie Morales is a 24 y.o. female  Presenting for 4-day course of malodorous urine, frequency, burning with urination.  Denies abdominal or back pain, discharge, fever.  Denies recent change in diet, lifestyle, medications.  Does admit to rough colitis recently: No known trauma.  Past Medical History:  Diagnosis Date  . Allergy   . Anemia   . Asthma   . Headache     Patient Active Problem List   Diagnosis Date Noted  . Tension headache, chronic 04/23/2015    Past Surgical History:  Procedure Laterality Date  . MOUTH SURGERY    . TONSILLECTOMY AND ADENOIDECTOMY Bilateral 2011    OB History    Gravida  0   Para  0   Term  0   Preterm  0   AB  0   Living  0     SAB  0   TAB  0   Ectopic  0   Multiple  0   Live Births  0            Home Medications    Prior to Admission medications   Medication Sig Start Date End Date Taking? Authorizing Provider  escitalopram (LEXAPRO) 10 MG tablet Take 30 mg by mouth every other day. 04/23/19   [provider]  norgestimate-ethinyl estradiol (ORTHO-CYCLEN) 0.25-35 MG-MCG tablet Take 1 tablet by mouth daily. 05/29/19   Sparacino, Margarette Asal, DO    Family History Family History  Problem Relation Age of Onset  . Migraines Mother   . ADD / ADHD Mother   . Depression Mother   . Anxiety disorder Mother   . Alcohol abuse Father   . Migraines Maternal Grandmother   . Asthma Maternal Grandfather     Social History Social History   Tobacco Use  . Smoking status: Never Smoker  . Smokeless tobacco: Never Used  Vaping Use  . Vaping Use: Never used  Substance Use Topics  . Alcohol use: No    Alcohol/week: 0.0 standard drinks  . Drug use: No     Allergies   Hydrocodone   Review of Systems As per  HPI   Physical Exam Triage Vital Signs ED Triage Vitals [02/08/20 1302]  Enc Vitals Group     BP 124/81     Pulse Rate 86     Resp 18     Temp 98.6 F (37 C)     Temp Source Oral     SpO2 98 %     Weight      Height      Head Circumference      Peak Flow      Pain Score 7     Pain Loc      Pain Edu?      Excl. in GC?    No data found.  Updated Vital Signs BP 124/81 (BP Location: Left Arm)   Pulse 86   Temp 98.6 F (37 C) (Oral)   Resp 18   LMP 01/13/2020   SpO2 98%   Visual Acuity Right Eye Distance:   Left Eye Distance:   Bilateral Distance:    Right Eye Near:   Left Eye Near:    Bilateral Near:     Physical Exam Constitutional:  General: She is not in acute distress. HENT:     Head: Normocephalic and atraumatic.  Eyes:     General: No scleral icterus.    Pupils: Pupils are equal, round, and reactive to light.  Cardiovascular:     Rate and Rhythm: Normal rate.  Pulmonary:     Effort: Pulmonary effort is normal.  Abdominal:     General: Bowel sounds are normal.     Palpations: Abdomen is soft.     Tenderness: There is no abdominal tenderness. There is no right CVA tenderness, left CVA tenderness or guarding.  Skin:    Coloration: Skin is not jaundiced or pale.  Neurological:     Mental Status: She is alert and oriented to person, place, and time.      UC Treatments / Results  Labs (all labs ordered are listed, but only abnormal results are displayed) Labs Reviewed  POCT URINALYSIS DIP (MANUAL ENTRY)  POCT URINE PREGNANCY    EKG   Radiology No results found.  Procedures Procedures (including critical care time)  Medications Ordered in UC Medications - No data to display  Initial Impression / Assessment and Plan / UC Course  I have reviewed the triage vital signs and the nursing notes.  Pertinent labs & imaging results that were available during my care of the patient were reviewed by me and considered in my medical decision  making (see chart for details).     Urine pregnancy negative, urine dip unremarkable.  Discussed burning can be due to microabrasion second to coitus.  Declines STI testing.  Return precautions discussed, pt verbalized understanding and is agreeable to plan. Final Clinical Impressions(s) / UC Diagnoses   Final diagnoses:  Dysuria     Discharge Instructions     Push fluids, keep track of symptoms & diet. Follow up with PCP as you may need referral to specialist if this continues.    ED Prescriptions    None     PDMP not reviewed this encounter.   Hall-Potvin, [place], [place] 02/08/20 1426

## 2020-02-12 ENCOUNTER — Other Ambulatory Visit: Payer: Self-pay

## 2020-02-12 MED ORDER — METRONIDAZOLE 500 MG PO TABS: 500.0000 mg | ORAL_TABLET | Freq: Two times a day (BID) | ORAL | 0 refills | Status: DC

## 2020-02-12 NOTE — Progress Notes (Signed)
TC from pt c/o thick malodorous vaginal discharge, denies itching. Metronidazole sent to pharmacy per protocol. Pt to c/b if sx's do not subside after txt.

## 2020-02-17 ENCOUNTER — Other Ambulatory Visit: Payer: Self-pay

## 2020-02-17 MED ORDER — FLUCONAZOLE 150 MG PO TABS: 150.0000 mg | ORAL_TABLET | Freq: Once | ORAL | 0 refills | Status: AC

## 2020-02-17 NOTE — Progress Notes (Signed)
Pt was sent Flagyl for BV last week.  She now c/o vaginal itching with "cottage-cheese" like discharge. Diflucan sent to the pharmacy per protocol.

## 2020-02-18 DIAGNOSIS — F3341 Major depressive disorder, recurrent, in partial remission: Secondary | ICD-10-CM | POA: Diagnosis not present

## 2020-02-24 ENCOUNTER — Encounter: Payer: Self-pay | Admitting: Physical Therapy

## 2020-02-24 ENCOUNTER — Ambulatory Visit: Payer: BLUE CROSS/BLUE SHIELD | Attending: Plastic Surgery | Admitting: Physical Therapy

## 2020-02-24 ENCOUNTER — Other Ambulatory Visit: Payer: Self-pay

## 2020-02-24 DIAGNOSIS — G8929 Other chronic pain: Secondary | ICD-10-CM

## 2020-02-24 DIAGNOSIS — M545 Low back pain, unspecified: Secondary | ICD-10-CM | POA: Insufficient documentation

## 2020-02-24 DIAGNOSIS — R293 Abnormal posture: Secondary | ICD-10-CM | POA: Insufficient documentation

## 2020-02-24 DIAGNOSIS — M546 Pain in thoracic spine: Secondary | ICD-10-CM | POA: Insufficient documentation

## 2020-02-24 NOTE — Therapy (Signed)
[facility] [address] [place], [place], [address] Phone: [phone]   Fax:  [phone]  Physical Therapy Evaluation  Patient Details  Name: ANAHITA CUA MRN: [id number] Date of Birth: 01-11-1996 Referring Provider (PT): Merry Proud, MD   Encounter Date: 02/24/2020   PT End of Session - 02/24/20 1204    Visit Number 1    Number of Visits 8    Date for PT Re-Evaluation 04/06/20    Authorization Type BCBS    PT Start Time 1017    PT Stop Time 1055    PT Time Calculation (min) 38 min    Activity Tolerance Patient tolerated treatment well    Behavior During Therapy [facility] for tasks assessed/performed           Past Medical History:  Diagnosis Date  . Allergy   . Anemia   . Asthma   . Headache     Past Surgical History:  Procedure Laterality Date  . MOUTH SURGERY    . TONSILLECTOMY AND ADENOIDECTOMY Bilateral 2011    There were no vitals filed for this visit.    Subjective Assessment - 02/24/20 1022    Subjective Pt. is a 24 y/o female referred to PT with c/o back pain associated with mammary hypertrophy diagnosis. She reports a history of pain x 1 year of gradual onset. She reports had gained some weight about 2 years ago at which point breast size increased-she subsequently lost weight but has continued with issues related to breast size and subsequent pain. Current symptoms include pain under her bra straps/in upper trapezius region bilaterally as well as mid + lower back pain. No UE/LE radiating or radicular symptoms noted.    Pertinent History asthma    Limitations Sitting;Lifting    Patient Stated Goals Get back better    Currently in Pain? Yes    Pain Score 8     Pain Location Back    Pain Orientation Upper;Lower    Pain Descriptors / Indicators Aching    Pain Type Chronic pain    Pain Onset More than a month ago    Pain Frequency Intermittent    Aggravating Factors  bending motions, sometimes with  prolonged sitting    Pain Relieving Factors ibuprofen, standing up straighter              [facility] PT Assessment - 02/24/20 0001      Assessment   Medical Diagnosis Hypertrophy of breast, LBP, postural kyphosis    Referring Provider (PT) Merry Proud, MD    Onset Date/Surgical Date 02/24/19   estimated per report of 1 year history of pain   Hand Dominance Right    Prior Therapy none      Precautions   Precautions None      Restrictions   Weight Bearing Restrictions No      Balance Screen   Has the patient fallen in the past 6 months No      Prior Function   Level of Independence Independent with basic ADLs    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      ROM / Strength   AROM / PROM / Strength AROM;Strength      AROM   Overall AROM Comments Bilateral shoulder and hip AROM grossly [facility]    AROM Assessment Site Cervical;Lumbar    Cervical Flexion 49    Cervical Extension 30    Cervical - Right Side Bend 32    Cervical - Left Side Bend 28    Cervical - Right Rotation 58    Cervical - Left Rotation 53    Lumbar Flexion 95    Lumbar Extension 33    Lumbar - Right Side Bend 33    Lumbar - Left Side Bend 32    Lumbar - Right Rotation WFL    Lumbar - Left Rotation [facility]      Strength   Overall Strength Comments Bilat. UE/LE strength grossly 5/5 excepting hip extension 4+/5 bilaterally      Flexibility   Soft Tissue Assessment /Muscle Length --   tight hamstrings bilat., SLR 70 deg     Palpation   Palpation comment Hypomobile thoracic and  lumbar PAs, tender with upper thoracic PAs and in lumbar spine worse caudally, tight/tender to palpation bilat. upper trapezius region and bilateral lumbar paraspinals      Special Tests   Other special tests SLR (-) bilat.                      Objective measurements completed on examination: See above findings.               PT Education - 02/24/20 1204    Education Details eval findings, HEP, POC    Person(s) Educated Patient    Methods Explanation;Demonstration;Verbal cues;Handout    Comprehension Returned demonstration;Verbalized understanding               PT Long Term Goals - 02/24/20 1211      PT LONG TERM GOAL #1   Title Independent with HEP    Baseline needs HEP    Time 6    Period Weeks    Status New    Target Date 04/06/20      PT LONG TERM GOAL #2   Title Increase cervical rotation AROM to at least 60 deg bilaterally to improve ability to turn head while driving    Baseline right 58 deg, left 53 deg    Time 6    Period Weeks    Status New    Target Date 04/06/20      PT LONG TERM GOAL #3   Title Perform bending/lifting activities for work with upper/lower back pain 3/10 or less at worst    Baseline 8/10    Time 6    Period Weeks    Target Date 04/06/20      PT LONG TERM GOAL #4   Title Return demos for postural education and body mechanics for lifting technique for work duties    Time 6    Period Weeks    Status New    Target Date 04/06/20      PT LONG TERM GOAL #5   Title Increase bilateral hip extension strength at least 1/2 MMT grade to improve ability for lifting activities at work with decreased back strain    Baseline 4+/5    Time 6    Period Weeks    Status New    Target Date 04/06/20  Plan - 02/24/20 1205    Clinical Impression Statement Pt. presents with chronic upper and lower back pain with underlying breast hypertrophy/postural contributing factors with back stiffness,  core/postural and posterior chain hip muscle weakness. Plan trial PT to see if symptoms can be improved with conservative tx. measures.    Personal Factors and Comorbidities Comorbidity 1;Time since onset of injury/illness/exacerbation    Comorbidities underlying breast hypertrophy    Examination-Activity Limitations Carry;Lift;Bend;Sit    Examination-Participation Restrictions Occupation    Stability/Clinical Decision Making Stable/Uncomplicated    Clinical Decision Making Low    Rehab Potential Fair    PT Frequency --   1-2x/week   PT Duration 6 weeks    PT Treatment/Interventions ADLs/Self Care Home Management;Cryotherapy;Electrical Stimulation;Therapeutic exercise;Neuromuscular re-education;Manual techniques;Patient/family education;Spinal Manipulations;Taping;Therapeutic activities;Moist Heat;Functional mobility training    PT Next Visit Plan review HEP as needed, work on general postural strengthening for upper back, for lower back core/back strengthening, stretches for upper traps, pecs and hamstrings + QL, manual prn for spinal mobs and STM    PT Home Exercise Plan Access code: [email or web address]    Consulted and Agree with Plan of Care Patient           Patient will benefit from skilled therapeutic intervention in order to improve the following deficits and impairments:  Pain, Postural dysfunction, Impaired flexibility, Decreased strength, Decreased activity tolerance, Decreased range of motion, Increased muscle spasms  Visit Diagnosis: Pain in thoracic spine  Chronic low back pain without sciatica, unspecified back pain laterality  Abnormal posture     Problem List Patient Active Problem List   Diagnosis Date Noted  . Tension headache, chronic 04/23/2015   Lazarus Gowda, PT, DPT 02/24/20 12:19 PM  [facility] [address] [place], [place], [address] Phone: [phone]   Fax:  [phone]  Name: YASMINA CHICO MRN:  [id number] Date of Birth: 30-Mar-1996

## 2020-03-02 ENCOUNTER — Telehealth: Payer: Self-pay

## 2020-03-02 ENCOUNTER — Ambulatory Visit: Payer: BLUE CROSS/BLUE SHIELD

## 2020-03-02 NOTE — Telephone Encounter (Signed)
Attempted to call patient due to "no show" for this afternoon's appointment but was unable to reach. Message stated "your call cannot be completed at this time," so PT was unable to leave voicemail for pt explaining no show/cancellation policy.  Rhea Bleacher, PT, DPT 03/02/20 7:54 PM

## 2020-03-09 ENCOUNTER — Ambulatory Visit: Payer: BLUE CROSS/BLUE SHIELD

## 2020-03-09 ENCOUNTER — Telehealth: Payer: Self-pay

## 2020-03-09 NOTE — Telephone Encounter (Signed)
PT attempted to call patient at number on file to remind patient of "no show" policy as this is her 2nd consecutive no show. Phone rang until message "we're sorry, your call cannot be completed at this time. Please try again" played with no option to leave a voicemail.  Rhea Bleacher, PT, DPT 03/09/20 4:52 PM

## 2020-03-10 ENCOUNTER — Other Ambulatory Visit: Payer: Self-pay | Admitting: *Deleted

## 2020-03-10 MED ORDER — FLUCONAZOLE 150 MG PO TABS: 150.0000 mg | ORAL_TABLET | Freq: Once | ORAL | 0 refills | Status: AC

## 2020-03-10 MED ORDER — METRONIDAZOLE 500 MG PO TABS: 500.0000 mg | ORAL_TABLET | Freq: Two times a day (BID) | ORAL | 0 refills | Status: DC

## 2020-03-10 NOTE — Progress Notes (Signed)
Pt called to office with symptoms of BV and also request tablet for yeast after antibiotic. Flagyl and Diflucan sent today.

## 2020-03-23 ENCOUNTER — Encounter: Payer: BLUE CROSS/BLUE SHIELD | Admitting: Physical Therapy

## 2020-03-30 ENCOUNTER — Encounter: Payer: BLUE CROSS/BLUE SHIELD | Admitting: Physical Therapy

## 2020-06-03 ENCOUNTER — Ambulatory Visit
Admission: EM | Admit: 2020-06-03 | Discharge: 2020-06-03 | Disposition: A | Discharge status: Home / Self Care | Payer: BLUE CROSS/BLUE SHIELD | Attending: Emergency Medicine | Admitting: Emergency Medicine

## 2020-06-03 ENCOUNTER — Other Ambulatory Visit: Payer: Self-pay

## 2020-06-03 DIAGNOSIS — J069 Acute upper respiratory infection, unspecified: Secondary | ICD-10-CM

## 2020-06-03 MED ORDER — CETIRIZINE HCL 10 MG PO TABS: 10.0000 mg | ORAL_TABLET | Freq: Every day | ORAL | 0 refills | Status: DC

## 2020-06-03 MED ORDER — BENZONATATE 100 MG PO CAPS: 100.0000 mg | ORAL_CAPSULE | Freq: Three times a day (TID) | ORAL | 0 refills | Status: DC

## 2020-06-03 MED ORDER — FLUTICASONE PROPIONATE 50 MCG/ACT NA SUSP: 1.0000 | Freq: Every day | NASAL | 0 refills | Status: DC

## 2020-06-03 NOTE — ED Provider Notes (Signed)
[facility]    CSN: [id number] Arrival date & time: 06/03/20  1455      History   Chief Complaint Chief Complaint  Patient presents with  . Nasal Congestion    HPI Vickie Morales is a 25 y.o. female   With history as below presenting for URI symptoms since last week.  Endorsing nasal congestion, sore throat, frontal headache, dry cough and fatigue.  States symptoms have been stable.  Not take anything for them.  Has taken 2 home COVID test which were negative-most recent was last night.  No known sick contacts.  Past Medical History:  Diagnosis Date  . Allergy   . Anemia   . Asthma   . Headache     Patient Active Problem List   Diagnosis Date Noted  . Tension headache, chronic 04/23/2015    Past Surgical History:  Procedure Laterality Date  . MOUTH SURGERY    . TONSILLECTOMY AND ADENOIDECTOMY Bilateral 2011    OB History    Gravida  0   Para  0   Term  0   Preterm  0   AB  0   Living  0     SAB  0   IAB  0   Ectopic  0   Multiple  0   Live Births  0            Home Medications    Prior to Admission medications   Medication Sig Start Date End Date Taking? Authorizing Provider  benzonatate (TESSALON) 100 MG capsule Take 1 capsule (100 mg total) by mouth every 8 (eight) hours. 06/03/20  Yes Hall-Potvin, [place], PA-C  cetirizine (ZYRTEC ALLERGY) 10 MG tablet Take 1 tablet (10 mg total) by mouth daily. 06/03/20  Yes Hall-Potvin, [place], PA-C  fluticasone (FLONASE) 50 MCG/ACT nasal spray Place 1 spray into both nostrils daily. 06/03/20  Yes Hall-Potvin, [place], PA-C  escitalopram (LEXAPRO) 10 MG tablet Take 30 mg by mouth every other day. 04/23/19   [provider]  norgestimate-ethinyl estradiol (ORTHO-CYCLEN) 0.25-35 MG-MCG tablet Take 1 tablet by mouth daily. 05/29/19   Sparacino, Margarette Asal, DO    Family History Family History  Problem Relation Age of Onset  . Migraines Mother   . ADD / ADHD Mother   .  Depression Mother   . Anxiety disorder Mother   . Alcohol abuse Father   . Migraines Maternal Grandmother   . Asthma Maternal Grandfather     Social History Social History   Tobacco Use  . Smoking status: Never Smoker  . Smokeless tobacco: Never Used  Vaping Use  . Vaping Use: Never used  Substance Use Topics  . Alcohol use: No    Alcohol/week: 0.0 standard drinks  . Drug use: No     Allergies   Hydrocodone   Review of Systems Review of Systems  Constitutional: Positive for fatigue. Negative for fever.  HENT: Positive for congestion and sore throat. Negative for dental problem, ear pain, facial swelling, hearing loss, sinus pain, trouble swallowing and voice change.   Eyes: Negative for photophobia, pain and visual disturbance.  Respiratory: Positive for cough. Negative for shortness of breath.   Cardiovascular: Negative for chest pain and palpitations.  Gastrointestinal: Negative for diarrhea and vomiting.  Musculoskeletal: Negative for arthralgias and myalgias.  Neurological: Negative for dizziness and headaches.     Physical Exam Triage Vital Signs ED Triage Vitals  Enc Vitals Group     BP 06/03/20 1539 114/80  Pulse Rate 06/03/20 1539 98     Resp 06/03/20 1539 16     Temp 06/03/20 1539 98.2 F (36.8 C)     Temp Source 06/03/20 1539 Oral     SpO2 06/03/20 1539 97 %     Weight --      Height --      Head Circumference --      Peak Flow --      Pain Score 06/03/20 1545 8     Pain Loc --      Pain Edu? --      Excl. in GC? --    No data found.  Updated Vital Signs BP 114/80   Pulse 98   Temp 98.2 F (36.8 C) (Oral)   Resp 16   LMP 06/03/2020   SpO2 97%   Visual Acuity Right Eye Distance:   Left Eye Distance:   Bilateral Distance:    Right Eye Near:   Left Eye Near:    Bilateral Near:     Physical Exam Constitutional:      General: She is not in acute distress.    Appearance: She is not ill-appearing or diaphoretic.  HENT:      Head: Normocephalic and atraumatic.     Right Ear: Tympanic membrane and ear canal normal.     Left Ear: Tympanic membrane and ear canal normal.     Mouth/Throat:     Mouth: Mucous membranes are moist.     Pharynx: Oropharynx is clear. No oropharyngeal exudate or posterior oropharyngeal erythema.  Eyes:     General: No scleral icterus.    Conjunctiva/sclera: Conjunctivae normal.     Pupils: Pupils are equal, round, and reactive to light.  Neck:     Comments: Trachea midline, negative JVD Cardiovascular:     Rate and Rhythm: Normal rate and regular rhythm.     Heart sounds: No murmur heard. No gallop.   Pulmonary:     Effort: Pulmonary effort is normal. No respiratory distress.     Breath sounds: No wheezing, rhonchi or rales.  Musculoskeletal:     Cervical back: Neck supple. No tenderness.  Lymphadenopathy:     Cervical: No cervical adenopathy.  Skin:    Capillary Refill: Capillary refill takes less than 2 seconds.     Coloration: Skin is not jaundiced or pale.     Findings: No rash.  Neurological:     General: No focal deficit present.     Mental Status: She is alert and oriented to person, place, and time.      UC Treatments / Results  Labs (all labs ordered are listed, but only abnormal results are displayed) Labs Reviewed - No data to display  EKG   Radiology No results found.  Procedures Procedures (including critical care time)  Medications Ordered in UC Medications - No data to display  Initial Impression / Assessment and Plan / UC Course  I have reviewed the triage vital signs and the nursing notes.  Pertinent labs & imaging results that were available during my care of the patient were reviewed by me and considered in my medical decision making (see chart for details).     Patient afebrile, nontoxic, with SpO2 97%.  Likely viral, has had multiple negative COVID test at home, most recently on day 6 of symptoms.  Will defer PCR at this time.  We will  treat supportively as outlined below.  Return precautions discussed, patient verbalized understanding and is agreeable to plan.  Final Clinical Impressions(s) / UC Diagnoses   Final diagnoses:  URI with cough and congestion     Discharge Instructions     Tessalon for cough. Start flonase, atrovent nasal spray for nasal congestion/drainage. You can use over the counter nasal saline rinse such as neti pot for nasal congestion. Keep hydrated, your urine should be clear to pale yellow in color. Tylenol/motrin for fever and pain. Monitor for any worsening of symptoms, chest pain, shortness of breath, wheezing, swelling of the throat, go to the emergency department for further evaluation needed.     ED Prescriptions    Medication Sig Dispense Auth. Provider   cetirizine (ZYRTEC ALLERGY) 10 MG tablet Take 1 tablet (10 mg total) by mouth daily. 30 tablet Hall-Potvin, [place], PA-C   fluticasone (FLONASE) 50 MCG/ACT nasal spray Place 1 spray into both nostrils daily. 16 g Hall-Potvin, [place], PA-C   benzonatate (TESSALON) 100 MG capsule Take 1 capsule (100 mg total) by mouth every 8 (eight) hours. 21 capsule Hall-Potvin, [place], PA-C     PDMP not reviewed this encounter.   Hall-Potvin, [place], [place] 06/03/20 1651

## 2020-06-03 NOTE — ED Triage Notes (Signed)
Pt c/o nasal congestion with green secretions, sore throat, headache, cough, and fatigue since last week. States had 2 neg home covid test.

## 2020-06-03 NOTE — Discharge Instructions (Signed)

## 2020-06-04 ENCOUNTER — Other Ambulatory Visit: Payer: Self-pay | Admitting: Obstetrics

## 2020-06-04 DIAGNOSIS — J019 Acute sinusitis, unspecified: Secondary | ICD-10-CM

## 2020-06-04 MED ORDER — AZITHROMYCIN 250 MG PO TABS: ORAL_TABLET | ORAL | 1 refills | Status: DC

## 2020-06-23 ENCOUNTER — Ambulatory Visit: Payer: BLUE CROSS/BLUE SHIELD | Admitting: Obstetrics

## 2020-07-05 ENCOUNTER — Other Ambulatory Visit (HOSPITAL_COMMUNITY)
Admission: RE | Admit: 2020-07-05 | Discharge: 2020-07-05 | Disposition: A | Discharge status: Home / Self Care | Payer: BLUE CROSS/BLUE SHIELD | Source: Ambulatory Visit | Attending: Obstetrics | Admitting: Obstetrics

## 2020-07-05 ENCOUNTER — Other Ambulatory Visit: Payer: Self-pay

## 2020-07-05 ENCOUNTER — Encounter: Payer: Self-pay | Admitting: Obstetrics

## 2020-07-05 ENCOUNTER — Ambulatory Visit (INDEPENDENT_AMBULATORY_CARE_PROVIDER_SITE_OTHER): Payer: BLUE CROSS/BLUE SHIELD | Admitting: Obstetrics

## 2020-07-05 ENCOUNTER — Ambulatory Visit: Payer: BLUE CROSS/BLUE SHIELD

## 2020-07-05 VITALS — BP 113/70 | HR 84 | Ht 63.0 in | Wt 223.0 lb

## 2020-07-05 DIAGNOSIS — Z3041 Encounter for surveillance of contraceptive pills: Secondary | ICD-10-CM | POA: Diagnosis not present

## 2020-07-05 DIAGNOSIS — Z01419 Encounter for gynecological examination (general) (routine) without abnormal findings: Secondary | ICD-10-CM

## 2020-07-05 DIAGNOSIS — Z113 Encounter for screening for infections with a predominantly sexual mode of transmission: Secondary | ICD-10-CM

## 2020-07-05 DIAGNOSIS — Z0000 Encounter for general adult medical examination without abnormal findings: Secondary | ICD-10-CM

## 2020-07-05 DIAGNOSIS — R300 Dysuria: Secondary | ICD-10-CM | POA: Diagnosis not present

## 2020-07-05 DIAGNOSIS — N898 Other specified noninflammatory disorders of vagina: Secondary | ICD-10-CM | POA: Diagnosis not present

## 2020-07-05 LAB — POCT URINALYSIS DIPSTICK
Bilirubin, UA: NEGATIVE
Blood, UA: NEGATIVE
Glucose, UA: NEGATIVE
Ketones, UA: NEGATIVE
Nitrite, UA: NEGATIVE
Protein, UA: NEGATIVE
Spec Grav, UA: 1.02 (ref 1.010–1.025)
Urobilinogen, UA: 0.2 E.U./dL
pH, UA: 6 (ref 5.0–8.0)

## 2020-07-05 MED ORDER — NORGESTIMATE-ETH ESTRADIOL 0.25-35 MG-MCG PO TABS: 1.0000 | ORAL_TABLET | Freq: Every day | ORAL | 11 refills | Status: DC

## 2020-07-05 MED ORDER — PHENTERMINE HCL 37.5 MG PO CAPS: 37.5000 mg | ORAL_CAPSULE | ORAL | 2 refills | Status: DC

## 2020-07-05 MED ORDER — NITROFURANTOIN MONOHYD MACRO 100 MG PO CAPS: 100.0000 mg | ORAL_CAPSULE | Freq: Two times a day (BID) | ORAL | 2 refills | Status: DC

## 2020-07-05 NOTE — Progress Notes (Signed)
Subjective:        Vickie Morales is a 25 y.o. female here for a routine exam.  Current complaints: Vaginal discharge.    Personal health questionnaire:  Is patient Ashkenazi Jewish, have a family history of breast and/or ovarian cancer: no Is there a family history of uterine cancer diagnosed at age < 32, gastrointestinal cancer, urinary tract cancer, family member who is a Personnel officer syndrome-associated carrier: no Is the patient overweight and hypertensive, family history of diabetes, personal history of gestational diabetes, preeclampsia or PCOS: no Is patient over 23, have PCOS,  family history of premature CHD under age 52, diabetes, smoke, have hypertension or peripheral artery disease:  no At any time, has a partner hit, kicked or otherwise hurt or frightened you?: no Over the past 2 weeks, have you felt down, depressed or hopeless?: no Over the past 2 weeks, have you felt little interest or pleasure in doing things?:no   Gynecologic History Patient's last menstrual period was 06/04/2020 (exact date). Contraception: OCP (estrogen/progesterone) Last Pap: 05-29-2019. Results were: normal Last mammogram: n/a. Results were: n/a  Obstetric History OB History  Gravida Para Term Preterm AB Living  0 0 0 0 0 0  SAB IAB Ectopic Multiple Live Births  0 0 0 0 0    Past Medical History:  Diagnosis Date  . Allergy   . Anemia   . Asthma   . Headache     Past Surgical History:  Procedure Laterality Date  . MOUTH SURGERY    . TONSILLECTOMY AND ADENOIDECTOMY Bilateral 2011     Current Outpatient Medications:  .  escitalopram (LEXAPRO) 10 MG tablet, Take 30 mg by mouth every other day., Disp: , Rfl:  .  nitrofurantoin, macrocrystal-monohydrate, (MACROBID) 100 MG capsule, Take 1 capsule (100 mg total) by mouth 2 (two) times daily. 1 po BID x 7days, Disp: 14 capsule, Rfl: 2 .  azithromycin (ZITHROMAX Z-PAK) 250 MG tablet, Take 2 tablets po load, then 1 tablet po daily. (Patient not  taking: Reported on 07/05/2020), Disp: 6 tablet, Rfl: 1 .  benzonatate (TESSALON) 100 MG capsule, Take 1 capsule (100 mg total) by mouth every 8 (eight) hours. (Patient not taking: Reported on 07/05/2020), Disp: 21 capsule, Rfl: 0 .  cetirizine (ZYRTEC ALLERGY) 10 MG tablet, Take 1 tablet (10 mg total) by mouth daily. (Patient not taking: Reported on 07/05/2020), Disp: 30 tablet, Rfl: 0 .  fluticasone (FLONASE) 50 MCG/ACT nasal spray, Place 1 spray into both nostrils daily. (Patient not taking: Reported on 07/05/2020), Disp: 16 g, Rfl: 0 .  norgestimate-ethinyl estradiol (ORTHO-CYCLEN) 0.25-35 MG-MCG tablet, Take 1 tablet by mouth daily., Disp: 28 tablet, Rfl: 11 .  phentermine 37.5 MG capsule, Take 1 capsule (37.5 mg total) by mouth every morning., Disp: 30 capsule, Rfl: 2 Allergies  Allergen Reactions  . Hydrocodone Swelling    Social History   Tobacco Use  . Smoking status: Never Smoker  . Smokeless tobacco: Never Used  Substance Use Topics  . Alcohol use: No    Alcohol/week: 0.0 standard drinks    Family History  Problem Relation Age of Onset  . Migraines Mother   . ADD / ADHD Mother   . Depression Mother   . Anxiety disorder Mother   . Alcohol abuse Father   . Migraines Maternal Grandmother   . Asthma Maternal Grandfather       Review of Systems  Constitutional: negative for fatigue and weight loss Respiratory: negative for cough and wheezing Cardiovascular:  negative for chest pain, fatigue and palpitations Gastrointestinal: negative for abdominal pain and change in bowel habits Musculoskeletal:negative for myalgias Neurological: negative for gait problems and tremors Behavioral/Psych: negative for abusive relationship, depression Endocrine: negative for temperature intolerance    Genitourinary:negative for abnormal menstrual periods, genital lesions, hot flashes, sexual problems.  positive forvaginal discharge Integument/breast: negative for breast lump, breast tenderness,  nipple discharge and skin lesion(s)    Objective:       BP 113/70   Pulse 84   Ht 5\' 3"  (1.6 m)   Wt 223 lb (101.2 kg)   LMP 06/04/2020 (Exact Date)   BMI 39.50 kg/m  General:   alert and no distress  Skin:   no rash or abnormalities  Lungs:   clear to auscultation bilaterally  Heart:   regular rate and rhythm, S1, S2 normal, no murmur, click, rub or gallop  Breasts:   normal without suspicious masses, skin or nipple changes or axillary nodes  Abdomen:  normal findings: no organomegaly, soft, non-tender and no hernia  Pelvis:  External genitalia: normal general appearance Urinary system: urethral meatus normal and bladder without fullness, nontender Vaginal: normal without tenderness, induration or masses Cervix: normal appearance Adnexa: normal bimanual exam Uterus: anteverted and non-tender, normal size   Lab Review Urine pregnancy test Labs reviewed yes Radiologic studies reviewed no  50% of 20 min visit spent on counseling and coordination of care.   Assessment:     1. Encounter for gynecological examination with Papanicolaou smear of cervix Rx: - Cytology - PAP( [place])  2. Vaginal discharge Rx: - Cervicovaginal ancillary only( [place])  3. Screening for STD (sexually transmitted disease) Rx: - RPR+HBsAg+HCVAb+...  4. Dysuria Rx: - Urine Culture - nitrofurantoin, macrocrystal-monohydrate, (MACROBID) 100 MG capsule; Take 1 capsule (100 mg total) by mouth 2 (two) times daily. 1 po BID x 7days  Dispense: 14 capsule; Refill: 2  5. Encounter for surveillance of contraceptive pills Rx: - POCT Urinalysis Dipstick - norgestimate-ethinyl estradiol (ORTHO-CYCLEN) 0.25-35 MG-MCG tablet; Take 1 tablet by mouth daily.  Dispense: 28 tablet; Refill: 11  6. Class 3 severe obesity due to excess calories without serious comorbidity in adult, unspecified BMI (HCC) Rx: - phentermine 37.5 MG capsule; Take 1 capsule (37.5 mg total) by mouth every morning.  Dispense:  30 capsule; Refill: 2  7. Routine adult health maintenance Rx: - CBC - Ferritin - Comprehensive metabolic panel - Hemoglobin A1c    Plan:    Education reviewed: calcium supplements, depression evaluation, low fat, low cholesterol diet, safe sex/STD prevention, self breast exams and weight bearing exercise. Contraception: OCP (estrogen/progesterone). Follow up in: 1 year.   Meds ordered this encounter  Medications  . norgestimate-ethinyl estradiol (ORTHO-CYCLEN) 0.25-35 MG-MCG tablet    Sig: Take 1 tablet by mouth daily.    Dispense:  28 tablet    Refill:  11  . phentermine 37.5 MG capsule    Sig: Take 1 capsule (37.5 mg total) by mouth every morning.    Dispense:  30 capsule    Refill:  2  . nitrofurantoin, macrocrystal-monohydrate, (MACROBID) 100 MG capsule    Sig: Take 1 capsule (100 mg total) by mouth 2 (two) times daily. 1 po BID x 7days    Dispense:  14 capsule    Refill:  2   Orders Placed This Encounter  Procedures  . Urine Culture  . RPR+HBsAg+HCVAb+...  . CBC  . Ferritin  . Comprehensive metabolic panel  . Hemoglobin A1c  . POCT  Urinalysis Dipstick    Brock Bad, MD 07/05/2020 11:22 AM

## 2020-07-05 NOTE — Progress Notes (Signed)
GYN presents for AEX/PAP/STD screening.  C/o dysuria

## 2020-07-06 ENCOUNTER — Other Ambulatory Visit: Payer: Self-pay | Admitting: Obstetrics

## 2020-07-06 DIAGNOSIS — R7303 Prediabetes: Secondary | ICD-10-CM

## 2020-07-06 DIAGNOSIS — D508 Other iron deficiency anemias: Secondary | ICD-10-CM

## 2020-07-06 LAB — CERVICOVAGINAL ANCILLARY ONLY
Bacterial Vaginitis (gardnerella): NEGATIVE
Candida Glabrata: NEGATIVE
Candida Vaginitis: NEGATIVE
Chlamydia: NEGATIVE
Comment: NEGATIVE
Comment: NEGATIVE
Comment: NEGATIVE
Comment: NEGATIVE
Comment: NEGATIVE
Comment: NORMAL
Neisseria Gonorrhea: NEGATIVE
Trichomonas: NEGATIVE

## 2020-07-06 LAB — COMPREHENSIVE METABOLIC PANEL
ALT: 18 IU/L (ref 0–32)
AST: 14 IU/L (ref 0–40)
Albumin/Globulin Ratio: 1.4 (ref 1.2–2.2)
Albumin: 4 g/dL (ref 3.9–5.0)
Alkaline Phosphatase: 53 IU/L (ref 44–121)
BUN/Creatinine Ratio: 12 (ref 9–23)
BUN: 8 mg/dL (ref 6–20)
Bilirubin Total: <0.2 mg/dL (ref 0.0–1.2)
CO2: 20 mmol/L (ref 20–29)
Calcium: 9.3 mg/dL (ref 8.7–10.2)
Chloride: 100 mmol/L (ref 96–106)
Creatinine, Ser: 0.66 mg/dL (ref 0.57–1.00)
GFR calc Af Amer: 145 mL/min/{1.73_m2} (ref >59)
GFR calc non Af Amer: 126 mL/min/{1.73_m2} (ref >59)
Globulin, Total: 2.9 g/dL (ref 1.5–4.5)
Glucose: 95 mg/dL (ref 65–99)
Potassium: 5 mmol/L (ref 3.5–5.2)
Sodium: 134 mmol/L (ref 134–144)
Total Protein: 6.9 g/dL (ref 6.0–8.5)

## 2020-07-06 LAB — CBC
Hematocrit: 35.9 % (ref 34.0–46.6)
Hemoglobin: 11 g/dL — ABNORMAL LOW (ref 11.1–15.9)
MCH: 22.4 pg — ABNORMAL LOW (ref 26.6–33.0)
MCHC: 30.6 g/dL — ABNORMAL LOW (ref 31.5–35.7)
MCV: 73 fL — ABNORMAL LOW (ref 79–97)
Platelets: 392 10*3/uL (ref 150–450)
RBC: 4.9 x10E6/uL (ref 3.77–5.28)
RDW: 14.5 % (ref 11.7–15.4)
WBC: 3.8 10*3/uL (ref 3.4–10.8)

## 2020-07-06 LAB — HEMOGLOBIN A1C
Est. average glucose Bld gHb Est-mCnc: 120 mg/dL
Hgb A1c MFr Bld: 5.8 % — ABNORMAL HIGH (ref 4.8–5.6)

## 2020-07-06 LAB — RPR+HBSAG+HCVAB+...
HIV Screen 4th Generation wRfx: NONREACTIVE
Hep C Virus Ab: 0.1 s/co ratio (ref 0.0–0.9)
Hepatitis B Surface Ag: NEGATIVE
RPR Ser Ql: NONREACTIVE

## 2020-07-06 LAB — CYTOLOGY - PAP: Diagnosis: NEGATIVE

## 2020-07-06 LAB — FERRITIN: Ferritin: 6 ng/mL — ABNORMAL LOW (ref 15–150)

## 2020-07-07 ENCOUNTER — Ambulatory Visit: Payer: BLUE CROSS/BLUE SHIELD

## 2020-07-07 LAB — URINE CULTURE

## 2020-07-29 ENCOUNTER — Encounter: Payer: Self-pay | Admitting: Internal Medicine

## 2020-08-12 ENCOUNTER — Emergency Department (HOSPITAL_COMMUNITY)
Admission: EM | Admit: 2020-08-12 | Discharge: 2020-08-12 | Disposition: A | Discharge status: Home / Self Care | Payer: No Typology Code available for payment source | Attending: Emergency Medicine | Admitting: Emergency Medicine

## 2020-08-12 ENCOUNTER — Other Ambulatory Visit: Payer: Self-pay

## 2020-08-12 ENCOUNTER — Emergency Department (HOSPITAL_COMMUNITY): Payer: No Typology Code available for payment source

## 2020-08-12 ENCOUNTER — Encounter (HOSPITAL_COMMUNITY): Payer: Self-pay

## 2020-08-12 DIAGNOSIS — S6991XA Unspecified injury of right wrist, hand and finger(s), initial encounter: Secondary | ICD-10-CM | POA: Diagnosis present

## 2020-08-12 DIAGNOSIS — Y990 Civilian activity done for income or pay: Secondary | ICD-10-CM | POA: Insufficient documentation

## 2020-08-12 DIAGNOSIS — S60221A Contusion of right hand, initial encounter: Secondary | ICD-10-CM | POA: Diagnosis not present

## 2020-08-12 DIAGNOSIS — W228XXA Striking against or struck by other objects, initial encounter: Secondary | ICD-10-CM | POA: Diagnosis not present

## 2020-08-12 DIAGNOSIS — J45909 Unspecified asthma, uncomplicated: Secondary | ICD-10-CM | POA: Diagnosis not present

## 2020-08-12 DIAGNOSIS — Y9289 Other specified places as the place of occurrence of the external cause: Secondary | ICD-10-CM | POA: Insufficient documentation

## 2020-08-12 DIAGNOSIS — M79641 Pain in right hand: Secondary | ICD-10-CM

## 2020-08-12 NOTE — ED Provider Notes (Signed)
[facility]-EMERGENCY DEPT Provider Note   CSN: [id number] Arrival date & time: 08/12/20  1823     History Chief Complaint  Patient presents with  . Hand Injury    Vickie Morales is a 25 y.o. female presented for evaluation of right hand injury.  Patient states around 230 this afternoon she accidentally hit the dorsal right hand against a glass divider.  She reports acute onset pain and swelling in the area.  This pain is worse with movement of her fingers, radiates to her wrist.  She has not taken anything for it including Tylenol or ibuprofen.  She has not applied any ice.  No injury elsewhere.  She is right-handed.  She has a history of anxiety which she takes medication, no other medical problems.  HPI     Past Medical History:  Diagnosis Date  . Allergy   . Anemia   . Asthma   . Headache     Patient Active Problem List   Diagnosis Date Noted  . Tension headache, chronic 04/23/2015    Past Surgical History:  Procedure Laterality Date  . MOUTH SURGERY    . TONSILLECTOMY AND ADENOIDECTOMY Bilateral 2011     OB History    Gravida  0   Para  0   Term  0   Preterm  0   AB  0   Living  0     SAB  0   IAB  0   Ectopic  0   Multiple  0   Live Births  0           Family History  Problem Relation Age of Onset  . Migraines Mother   . ADD / ADHD Mother   . Depression Mother   . Anxiety disorder Mother   . Alcohol abuse Father   . Migraines Maternal Grandmother   . Asthma Maternal Grandfather     Social History   Tobacco Use  . Smoking status: Never Smoker  . Smokeless tobacco: Never Used  Vaping Use  . Vaping Use: Never used  Substance Use Topics  . Alcohol use: No    Alcohol/week: 0.0 standard drinks  . Drug use: No    Home Medications Prior to Admission medications   Medication Sig Start Date End Date Taking? Authorizing Provider  azithromycin (ZITHROMAX Z-PAK) 250 MG tablet Take 2 tablets po load, then 1  tablet po daily. Patient not taking: Reported on 07/05/2020 06/04/20   Brock Bad, MD  benzonatate (TESSALON) 100 MG capsule Take 1 capsule (100 mg total) by mouth every 8 (eight) hours. Patient not taking: Reported on 07/05/2020 06/03/20   Hall-Potvin, [place], PA-C  cetirizine (ZYRTEC ALLERGY) 10 MG tablet Take 1 tablet (10 mg total) by mouth daily. Patient not taking: Reported on 07/05/2020 06/03/20   Hall-Potvin, [place], PA-C  escitalopram (LEXAPRO) 10 MG tablet Take 30 mg by mouth every other day. 04/23/19   [provider]  fluticasone (FLONASE) 50 MCG/ACT nasal spray Place 1 spray into both nostrils daily. Patient not taking: Reported on 07/05/2020 06/03/20   Hall-Potvin, [place], PA-C  nitrofurantoin, macrocrystal-monohydrate, (MACROBID) 100 MG capsule Take 1 capsule (100 mg total) by mouth 2 (two) times daily. 1 po BID x 7days 07/05/20   Brock Bad, MD  norgestimate-ethinyl estradiol (ORTHO-CYCLEN) 0.25-35 MG-MCG tablet Take 1 tablet by mouth daily. 07/05/20   Brock Bad, MD  phentermine 37.5 MG capsule Take 1 capsule (37.5 mg total) by mouth every  morning. 07/05/20   Brock Bad, MD    Allergies    Hydrocodone  Review of Systems   Review of Systems  Musculoskeletal: Positive for arthralgias.  Neurological: Negative for numbness.    Physical Exam Updated Vital Signs BP 127/86 (BP Location: Left Arm)   Pulse (!) 120   Temp 98.2 F (36.8 C) (Oral)   Resp 18   Ht 5\' 3"  (1.6 m)   Wt 99.8 kg   LMP 08/07/2020   SpO2 100%   BMI 38.97 kg/m   Physical Exam Vitals and nursing note reviewed.  Constitutional:      General: She is not in acute distress.    Appearance: She is well-developed.     Comments: Resting in the chair no acute distress  HENT:     Head: Normocephalic and atraumatic.  Pulmonary:     Effort: Pulmonary effort is normal.  Abdominal:     General: There is no distension.  Musculoskeletal:        General: Normal range of  motion.     Cervical back: Normal range of motion.     Comments: Small, dime size contusion of the right dorsal hand over the mid metacarpal.  No obvious laceration.  Full active range of motion of all fingers and wrist without difficulty.  Good distal sensation and cap refill.  Skin:    General: Skin is warm.     Capillary Refill: Capillary refill takes less than 2 seconds.     Findings: No rash.  Neurological:     Mental Status: She is alert and oriented to person, place, and time.     ED Results / Procedures / Treatments   Labs (all labs ordered are listed, but only abnormal results are displayed) Labs Reviewed - No data to display  EKG None  Radiology DG Hand Complete Right  Result Date: 08/12/2020 CLINICAL DATA:  Hand pain following blunt trauma, initial encounter EXAM: RIGHT HAND - COMPLETE 3+ VIEW COMPARISON:  None. FINDINGS: There is no evidence of fracture or dislocation. There is no evidence of arthropathy or other focal bone abnormality. Soft tissues are unremarkable. IMPRESSION: No acute abnormality noted. Electronically Signed   By: 08/14/2020 M.D.   On: 08/12/2020 20:56    Procedures Procedures   Medications Ordered in ED Medications - No data to display  ED Course  I have reviewed the triage vital signs and the nursing notes.  Pertinent labs & imaging results that were available during my care of the patient were reviewed by me and considered in my medical decision making (see chart for details).    MDM Rules/Calculators/A&P                          Patient presented for evaluation of right hand pain.  On exam, patient peers nontoxic.  She is neurovascular intact.  She does have a small contusion.  Low suspicion for fracture in the setting of a mostly normal exam, however is patient reports continued pain and is extremely concerned about a fracture, will obtain x-ray. Of note, pts HR is mildly elevated. Likely 2/2 pain. HR improved without intervention in the  ED.  X-ray viewed interpreted by me, no fracture dislocation.  Discussed findings with patient.  Discussed symptomatic treatment with Tylenol, ibuprofen, ice.  At this time, patient appears safe for discharge.  Return precautions given.  Patient states she understands and agrees to plan.  Final Clinical Impression(s) /  ED Diagnoses Final diagnoses:  Right hand pain    Rx / DC Orders ED Discharge Orders    None       Alveria Apley, PA-C 08/12/20 2125    Terrilee Files, MD 08/13/20 1034

## 2020-08-12 NOTE — ED Triage Notes (Addendum)
Patient states she hit her right hand on a display at work today.

## 2020-08-12 NOTE — Discharge Instructions (Signed)
Take ibuprofen 3 times a day with meals.  Do not take other anti-inflammatories at the same time (Advil, Motrin, naproxen, Aleve). You may supplement with Tylenol if you need further pain control. Use ice packs for pain and swelling. Follow-up with your primary care doctor if you continue to have pain for more than a week. Return to the emergency room with any new, worsening, concerning symptoms.

## 2020-11-24 DIAGNOSIS — F3341 Major depressive disorder, recurrent, in partial remission: Secondary | ICD-10-CM | POA: Diagnosis not present

## 2020-12-31 DIAGNOSIS — F3341 Major depressive disorder, recurrent, in partial remission: Secondary | ICD-10-CM | POA: Diagnosis not present

## 2021-03-08 DIAGNOSIS — N390 Urinary tract infection, site not specified: Secondary | ICD-10-CM | POA: Diagnosis not present

## 2021-03-22 DIAGNOSIS — F3341 Major depressive disorder, recurrent, in partial remission: Secondary | ICD-10-CM | POA: Diagnosis not present

## 2021-04-19 DIAGNOSIS — F3341 Major depressive disorder, recurrent, in partial remission: Secondary | ICD-10-CM | POA: Diagnosis not present

## 2021-06-04 ENCOUNTER — Other Ambulatory Visit: Payer: Self-pay | Admitting: Obstetrics

## 2021-06-04 DIAGNOSIS — Z3041 Encounter for surveillance of contraceptive pills: Secondary | ICD-10-CM

## 2021-06-09 ENCOUNTER — Other Ambulatory Visit: Payer: Self-pay | Admitting: Obstetrics

## 2021-06-09 DIAGNOSIS — Z3041 Encounter for surveillance of contraceptive pills: Secondary | ICD-10-CM

## 2021-06-29 ENCOUNTER — Other Ambulatory Visit: Payer: Self-pay | Admitting: Obstetrics

## 2021-06-29 DIAGNOSIS — Z3041 Encounter for surveillance of contraceptive pills: Secondary | ICD-10-CM

## 2021-07-02 DIAGNOSIS — J029 Acute pharyngitis, unspecified: Secondary | ICD-10-CM | POA: Diagnosis not present

## 2021-07-02 DIAGNOSIS — U071 COVID-19: Secondary | ICD-10-CM | POA: Diagnosis not present

## 2021-07-02 DIAGNOSIS — J069 Acute upper respiratory infection, unspecified: Secondary | ICD-10-CM | POA: Diagnosis not present

## 2021-07-04 ENCOUNTER — Other Ambulatory Visit: Payer: Self-pay | Admitting: Obstetrics

## 2021-07-04 DIAGNOSIS — J019 Acute sinusitis, unspecified: Secondary | ICD-10-CM

## 2021-07-27 ENCOUNTER — Other Ambulatory Visit: Payer: Self-pay | Admitting: Obstetrics

## 2021-07-27 DIAGNOSIS — J019 Acute sinusitis, unspecified: Secondary | ICD-10-CM

## 2021-07-29 ENCOUNTER — Ambulatory Visit (INDEPENDENT_AMBULATORY_CARE_PROVIDER_SITE_OTHER): Payer: BLUE CROSS/BLUE SHIELD | Admitting: Emergency Medicine

## 2021-07-29 ENCOUNTER — Other Ambulatory Visit (HOSPITAL_COMMUNITY)
Admission: RE | Admit: 2021-07-29 | Discharge: 2021-07-29 | Disposition: A | Discharge status: Home / Self Care | Payer: BLUE CROSS/BLUE SHIELD | Source: Ambulatory Visit | Attending: Obstetrics & Gynecology | Admitting: Obstetrics & Gynecology

## 2021-07-29 ENCOUNTER — Other Ambulatory Visit: Payer: Self-pay

## 2021-07-29 VITALS — BP 124/83 | HR 96 | Wt 206.7 lb

## 2021-07-29 DIAGNOSIS — B3731 Acute candidiasis of vulva and vagina: Secondary | ICD-10-CM | POA: Diagnosis not present

## 2021-07-29 DIAGNOSIS — N898 Other specified noninflammatory disorders of vagina: Secondary | ICD-10-CM

## 2021-07-29 NOTE — Progress Notes (Signed)
SUBJECTIVE:  ?26 y.o. female complains of [place] vaginal discharge for 4-5 day(s). ?Denies abnormal vaginal bleeding or significant pelvic pain or ?fever. No UTI symptoms. Denies history of known exposure to STD. ? ?LMP- 07/13/21 ? ?OBJECTIVE:  ?She appears well, afebrile. ?Urine dipstick: not done. ? ?ASSESSMENT:  ?Vaginal Discharge - Thick, [place] ?Vaginal Odor- Present ? ? ?PLAN:  ?GC, chlamydia, trichomonas, BVAG, CVAG probe sent to lab. ?Treatment: To be determined once lab results are received ?ROV prn if symptoms persist or worsen.  ?

## 2021-08-02 ENCOUNTER — Other Ambulatory Visit: Payer: Self-pay | Admitting: Obstetrics & Gynecology

## 2021-08-02 DIAGNOSIS — N898 Other specified noninflammatory disorders of vagina: Secondary | ICD-10-CM

## 2021-08-02 LAB — CERVICOVAGINAL ANCILLARY ONLY
Bacterial Vaginitis (gardnerella): NEGATIVE
Candida Glabrata: NEGATIVE
Candida Vaginitis: POSITIVE — AB
Chlamydia: NEGATIVE
Comment: NEGATIVE
Comment: NEGATIVE
Comment: NEGATIVE
Comment: NEGATIVE
Comment: NEGATIVE
Comment: NORMAL
Neisseria Gonorrhea: NEGATIVE
Trichomonas: NEGATIVE

## 2021-08-02 MED ORDER — FLUCONAZOLE 150 MG PO TABS: 150.0000 mg | ORAL_TABLET | Freq: Once | ORAL | 0 refills | Status: AC

## 2021-08-16 ENCOUNTER — Other Ambulatory Visit: Payer: Self-pay | Admitting: Obstetrics & Gynecology

## 2021-08-28 ENCOUNTER — Other Ambulatory Visit: Payer: Self-pay | Admitting: Obstetrics

## 2021-08-28 DIAGNOSIS — Z3041 Encounter for surveillance of contraceptive pills: Secondary | ICD-10-CM

## 2021-08-28 IMAGING — CR DG CHEST 2V
2 series · 2 of 2 positions shown · non-contrast
Comparison: Chest x-ray 10/28/2018.

CLINICAL DATA: 23-year-old female with history of tachycardia.

EXAM:
CHEST - 2 VIEW

[chest pa]
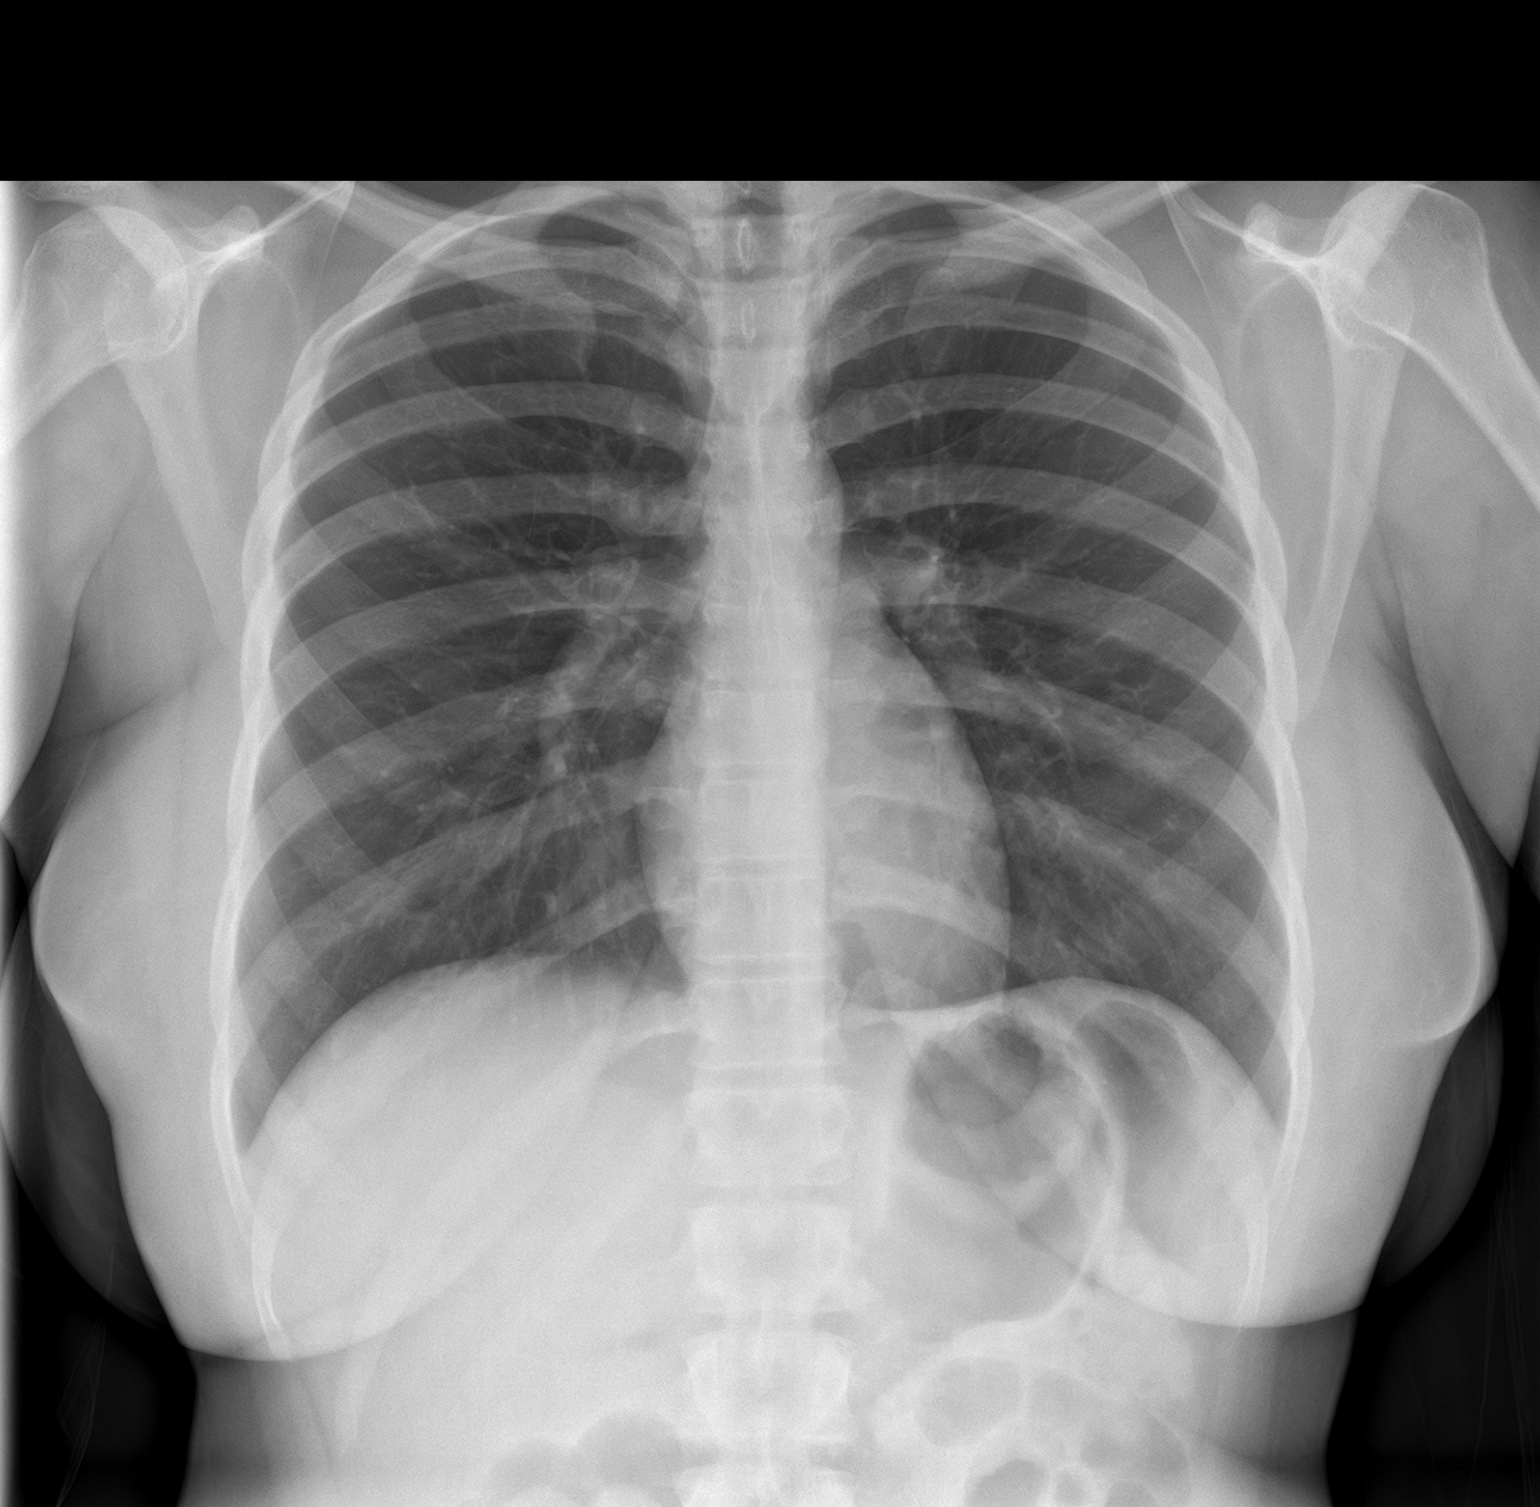

[chest lat]
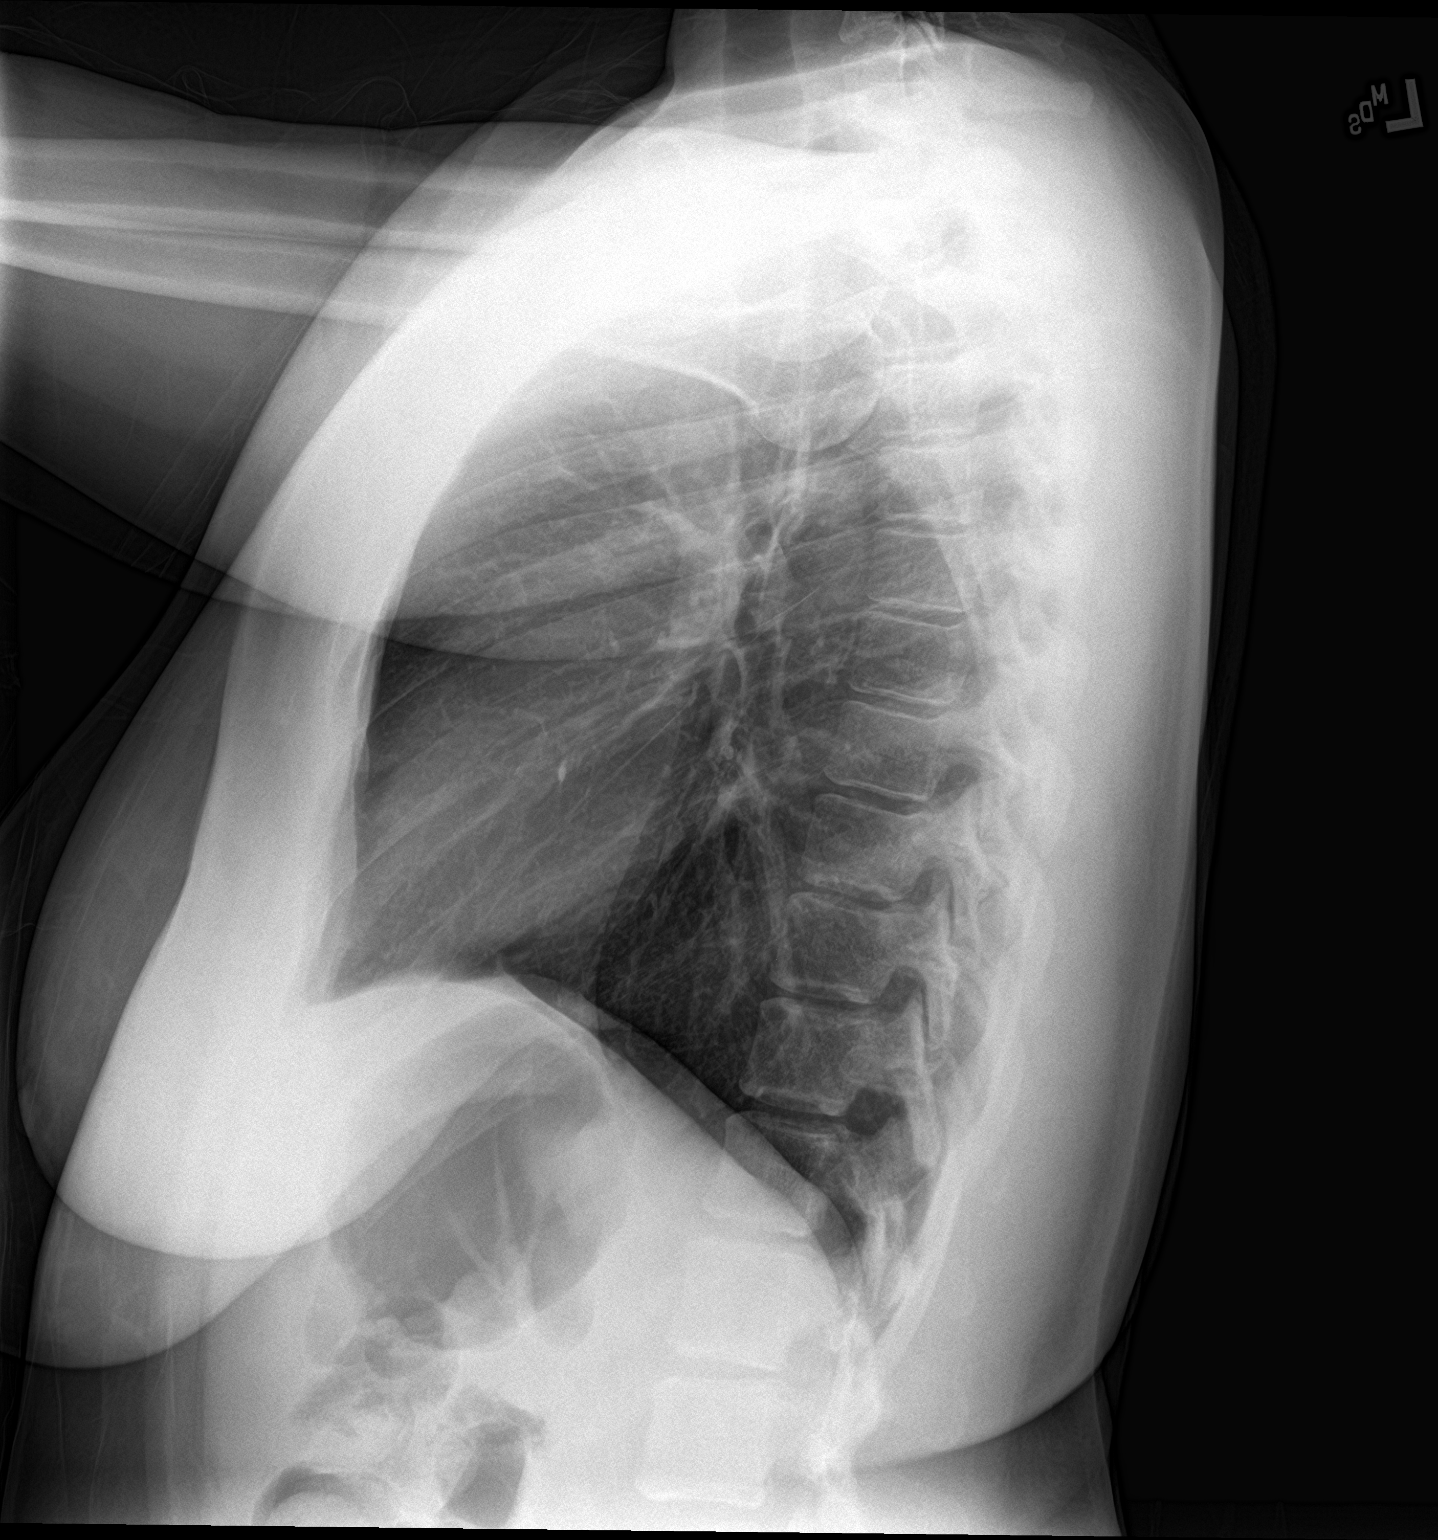

[2 of 2 positions shown; findings below may reference images not displayed]

FINDINGS: Lung volumes are normal. No consolidative airspace disease. No
pleural effusions. No pneumothorax. No pulmonary nodule or mass
noted. Pulmonary vasculature and the cardiomediastinal silhouette
are within normal limits.
IMPRESSION: No radiographic evidence of acute cardiopulmonary disease.

## 2021-09-01 ENCOUNTER — Ambulatory Visit (INDEPENDENT_AMBULATORY_CARE_PROVIDER_SITE_OTHER): Payer: BC Managed Care – PPO | Admitting: Obstetrics

## 2021-09-01 ENCOUNTER — Other Ambulatory Visit (HOSPITAL_COMMUNITY)
Admission: RE | Admit: 2021-09-01 | Discharge: 2021-09-01 | Disposition: A | Discharge status: Home / Self Care | Payer: BC Managed Care – PPO | Source: Ambulatory Visit | Attending: Obstetrics | Admitting: Obstetrics

## 2021-09-01 ENCOUNTER — Encounter: Payer: Self-pay | Admitting: Obstetrics

## 2021-09-01 VITALS — BP 124/84 | HR 103 | Ht 63.0 in | Wt 217.2 lb

## 2021-09-01 DIAGNOSIS — Z3041 Encounter for surveillance of contraceptive pills: Secondary | ICD-10-CM

## 2021-09-01 DIAGNOSIS — Z01419 Encounter for gynecological examination (general) (routine) without abnormal findings: Secondary | ICD-10-CM | POA: Insufficient documentation

## 2021-09-01 DIAGNOSIS — E669 Obesity, unspecified: Secondary | ICD-10-CM

## 2021-09-01 DIAGNOSIS — Z30016 Encounter for initial prescription of transdermal patch hormonal contraceptive device: Secondary | ICD-10-CM | POA: Diagnosis not present

## 2021-09-01 DIAGNOSIS — N898 Other specified noninflammatory disorders of vagina: Secondary | ICD-10-CM | POA: Diagnosis not present

## 2021-09-01 MED ORDER — XULANE 150-35 MCG/24HR TD PTWK: 1.0000 | MEDICATED_PATCH | TRANSDERMAL | 12 refills | Status: DC

## 2021-09-01 NOTE — Progress Notes (Signed)
? ?Subjective: ? ? ?  ?  ? Vickie Morales is a 26 y.o. female here for a routine exam.  Current complaints: Vaginal discharge.   ? ?Personal health questionnaire:  ?Is patient Ashkenazi Jewish, have a family history of breast and/or ovarian cancer: no ?Is there a family history of uterine cancer diagnosed at age < 23, gastrointestinal cancer, urinary tract cancer, family member who is a Personnel officer syndrome-associated carrier: no ?Is the patient overweight and hypertensive, family history of diabetes, personal history of gestational diabetes, preeclampsia or PCOS: no ?Is patient over 24, have PCOS,  family history of premature CHD under age 85, diabetes, smoke, have hypertension or peripheral artery disease:  no ?At any time, has a partner hit, kicked or otherwise hurt or frightened you?: no ?Over the past 2 weeks, have you felt down, depressed or hopeless?: no ?Over the past 2 weeks, have you felt little interest or pleasure in doing things?:no ? ? ?Gynecologic History ?Patient's last menstrual period was 07/13/2021. ?Contraception: OCP (estrogen/progesterone) ?Last Pap: 07-05-2020. Results were: normal ?Last mammogram: n/a. Results were: n/a ? ?Obstetric History ?OB History  ?Gravida Para Term Preterm AB Living  ?0 0 0 0 0 0  ?SAB IAB Ectopic Multiple Live Births  ?0 0 0 0 0  ? ? ?Past Medical History:  ?Diagnosis Date  ? Allergy   ? Anemia   ? Asthma   ? Headache   ?  ?Past Surgical History:  ?Procedure Laterality Date  ? MOUTH SURGERY    ? TONSILLECTOMY AND ADENOIDECTOMY Bilateral 2011  ?  ? ?Current Outpatient Medications:  ?  norelgestromin-ethinyl estradiol Burr Medico) 150-35 MCG/24HR transdermal patch, Place 1 patch onto the skin once a week., Disp: 3 patch, Rfl: 12 ?  azithromycin (ZITHROMAX) 250 MG tablet, TAKE 2 TABLETS BY MOUTH ON DAY 1, AND THEN TAKE 1 TABLET BY MOUTH ONCE A DAY ON DAY 2 THROUGH DAY 5 (Patient not taking: Reported on 09/01/2021), Disp: 6 tablet, Rfl: 0 ?  benzonatate (TESSALON) 100 MG capsule,  Take 1 capsule (100 mg total) by mouth every 8 (eight) hours. (Patient not taking: Reported on 07/05/2020), Disp: 21 capsule, Rfl: 0 ?  cetirizine (ZYRTEC ALLERGY) 10 MG tablet, Take 1 tablet (10 mg total) by mouth daily. (Patient not taking: Reported on 07/05/2020), Disp: 30 tablet, Rfl: 0 ?  escitalopram (LEXAPRO) 10 MG tablet, Take 30 mg by mouth every other day. (Patient not taking: Reported on 07/29/2021), Disp: , Rfl:  ?  fluticasone (FLONASE) 50 MCG/ACT nasal spray, Place 1 spray into both nostrils daily. (Patient not taking: Reported on 07/05/2020), Disp: 16 g, Rfl: 0 ?  nitrofurantoin, macrocrystal-monohydrate, (MACROBID) 100 MG capsule, Take 1 capsule (100 mg total) by mouth 2 (two) times daily. 1 po BID x 7days (Patient not taking: Reported on 07/29/2021), Disp: 14 capsule, Rfl: 2 ?  phentermine 37.5 MG capsule, Take 1 capsule (37.5 mg total) by mouth every morning. (Patient not taking: Reported on 07/29/2021), Disp: 30 capsule, Rfl: 2 ?Allergies  ?Allergen Reactions  ? Hydrocodone Swelling  ?  ?Social History  ? ?Tobacco Use  ? Smoking status: Never  ? Smokeless tobacco: Never  ?Substance Use Topics  ? Alcohol use: No  ?  Alcohol/week: 0.0 standard drinks  ?  ?Family History  ?Problem Relation Age of Onset  ? Migraines Mother   ? ADD / ADHD Mother   ? Depression Mother   ? Anxiety disorder Mother   ? Alcohol abuse Father   ? Migraines Maternal  Grandmother   ? Asthma Maternal Grandfather   ?  ? ? ?Review of Systems ? ?Constitutional: negative for fatigue and weight loss ?Respiratory: negative for cough and wheezing ?Cardiovascular: negative for chest pain, fatigue and palpitations ?Gastrointestinal: negative for abdominal pain and change in bowel habits ?Musculoskeletal:negative for myalgias ?Neurological: negative for gait problems and tremors ?Behavioral/Psych: negative for abusive relationship, depression ?Endocrine: negative for temperature intolerance    ?Genitourinary: positive for vaginal discharge.   negative for abnormal menstrual periods, genital lesions, hot flashes, sexual problems  ?Integument/breast: negative for breast lump, breast tenderness, nipple discharge and skin lesion(s) ? ?  ?Objective:  ? ?    ?BP 124/84   Pulse (!) 103   Ht 5\' 3"  (1.6 m)   Wt 217 lb 3.2 oz (98.5 kg)   LMP 07/13/2021 Comment: [place]  BMI 38.48 kg/m?  ?General:   Alert and no distyress  ?Skin:   no rash or abnormalities  ?Lungs:   clear to auscultation bilaterally  ?Heart:   regular rate and rhythm, S1, S2 normal, no murmur, click, rub or gallop  ?Breasts:   normal without suspicious masses, skin or nipple changes or axillary nodes  ?Abdomen:  normal findings: no organomegaly, soft, non-tender and no hernia  ?Pelvis:  External genitalia: normal general appearance ?Urinary system: urethral meatus normal and bladder without fullness, nontender ?Vaginal: normal without tenderness, induration or masses ?Cervix: normal appearance ?Adnexa: normal bimanual exam ?Uterus: anteverted and non-tender, normal size  ? ?Lab Review ?Urine pregnancy test ?Labs reviewed yes ?Radiologic studies reviewed no ? ?I have spent a total of 20 minutes of face-to-face time, excluding clinical staff time, reviewing notes and preparing to see patient, ordering tests and/or medications, and counseling the patient.  ? ?Assessment:  ? ?  1. Encounter for gynecological examination with Papanicolaou smear of cervix ?Rx: ?- Cytology - PAP( [place]) ? ?2. Vaginal discharge ?Rx: ?- Cervicovaginal ancillary only ? ?3. Encounter for surveillance of contraceptive pills ?- can't remember to take OCP's daily.  Wants to switch to the patch ? ?4. Encounter for initial prescription of transdermal patch hormonal contraceptive device ?Rx: ?- norelgestromin-ethinyl estradiol 09/12/2021) 150-35 MCG/24HR transdermal patch; Place 1 patch onto the skin once a week.  Dispense: 3 patch; Refill: 12 ? ?5. Obesity (BMI 35.0-39.9 without comorbidity) ?- weight reduction with the aid  of dietary changes, exercise and behavioral modification recommended ? ?  ?Plan:  ? ? Education reviewed: calcium supplements, depression evaluation, low fat, low cholesterol diet, safe sex/STD prevention, self breast exams, and weight bearing exercise. ?Contraception: 04-14-1988 weekly. ?Follow up in: 1 year.  ? ?Meds ordered this encounter  ?Medications  ? norelgestromin-ethinyl estradiol Financial risk analyst) 150-35 MCG/24HR transdermal patch  ?  Sig: Place 1 patch onto the skin once a week.  ?  Dispense:  3 patch  ?  Refill:  12  ? ? ? ? Burr Medico, MD ?09/01/2021 12:55 PM  ?

## 2021-09-01 NOTE — Progress Notes (Signed)
Pt presents for annual exam. Pt declines STD testing. Pt does not have any concerns today.  ? ?Last PAP: 07-05-20 ?

## 2021-09-02 LAB — CERVICOVAGINAL ANCILLARY ONLY
Bacterial Vaginitis (gardnerella): NEGATIVE
Candida Glabrata: NEGATIVE
Candida Vaginitis: NEGATIVE
Chlamydia: NEGATIVE
Comment: NEGATIVE
Comment: NEGATIVE
Comment: NEGATIVE
Comment: NEGATIVE
Comment: NEGATIVE
Comment: NORMAL
Neisseria Gonorrhea: NEGATIVE
Trichomonas: NEGATIVE

## 2021-09-03 ENCOUNTER — Encounter: Payer: Self-pay | Admitting: Obstetrics

## 2021-09-07 LAB — CYTOLOGY - PAP
Comment: NEGATIVE
Diagnosis: UNDETERMINED — AB
High risk HPV: NEGATIVE

## 2021-11-14 DIAGNOSIS — E6609 Other obesity due to excess calories: Secondary | ICD-10-CM | POA: Diagnosis not present

## 2021-11-14 DIAGNOSIS — Z6839 Body mass index (BMI) 39.0-39.9, adult: Secondary | ICD-10-CM | POA: Diagnosis not present

## 2021-12-05 ENCOUNTER — Encounter: Payer: Self-pay | Admitting: Advanced Practice Midwife

## 2021-12-05 ENCOUNTER — Other Ambulatory Visit (HOSPITAL_COMMUNITY)
Admission: RE | Admit: 2021-12-05 | Discharge: 2021-12-05 | Disposition: A | Discharge status: Home / Self Care | Payer: BC Managed Care – PPO | Source: Ambulatory Visit | Attending: Advanced Practice Midwife | Admitting: Advanced Practice Midwife

## 2021-12-05 ENCOUNTER — Ambulatory Visit (INDEPENDENT_AMBULATORY_CARE_PROVIDER_SITE_OTHER): Payer: BC Managed Care – PPO | Admitting: Advanced Practice Midwife

## 2021-12-05 VITALS — BP 114/75 | HR 82 | Ht 63.0 in | Wt 227.0 lb

## 2021-12-05 DIAGNOSIS — R102 Pelvic and perineal pain: Secondary | ICD-10-CM | POA: Diagnosis not present

## 2021-12-05 DIAGNOSIS — B3731 Acute candidiasis of vulva and vagina: Secondary | ICD-10-CM | POA: Diagnosis not present

## 2021-12-05 DIAGNOSIS — N760 Acute vaginitis: Secondary | ICD-10-CM

## 2021-12-05 DIAGNOSIS — G8929 Other chronic pain: Secondary | ICD-10-CM | POA: Insufficient documentation

## 2021-12-05 DIAGNOSIS — B9689 Other specified bacterial agents as the cause of diseases classified elsewhere: Secondary | ICD-10-CM

## 2021-12-05 DIAGNOSIS — Z113 Encounter for screening for infections with a predominantly sexual mode of transmission: Secondary | ICD-10-CM

## 2021-12-05 LAB — POCT URINALYSIS DIPSTICK
Bilirubin, UA: NEGATIVE
Blood, UA: POSITIVE
Glucose, UA: NEGATIVE
Ketones, UA: NEGATIVE
Leukocytes, UA: NEGATIVE
Nitrite, UA: NEGATIVE
Protein, UA: POSITIVE — AB
Spec Grav, UA: 1.015 (ref 1.010–1.025)
Urobilinogen, UA: NEGATIVE E.U./dL — AB
pH, UA: 6.5 (ref 5.0–8.0)

## 2021-12-05 NOTE — Progress Notes (Signed)
GYN presents for pelvic pain 7/10, mostly during sex x 2 months and pain when she urinates.

## 2021-12-05 NOTE — Progress Notes (Signed)
   GYNECOLOGY PROGRESS NOTE  History:  [name] presents to [facility] office today for problem gyn visit. She reports pain with intercourse x 2 months.  She was taking SSRI for depression but recently switched to Wellbutrin to decrease sexual side effects.  She reports feeling dry with burning during intercourse but also sharp pelvic pain too.  She denies h/a, dizziness, shortness of breath, n/v, or fever/chills.    The following portions of the patient's history were reviewed and updated as appropriate: allergies, current medications, past family history, past medical history, past social history, past surgical history and problem list. Last pap smear on 09/01/21 was abnormal with ASCUS, neg HPV, repeat recommended in 1 year.  Health Maintenance Due  Topic Date Due   COVID-19 Vaccine (1) Never done     Review of Systems:  Pertinent items are noted in HPI.   Objective:  Physical Exam Blood pressure 114/75, pulse 82, height 5\' 3"  (1.6 m), weight 227 lb (103 kg), last menstrual period 11/30/2021. VS reviewed, nursing note reviewed,  Constitutional: well developed, well nourished, no distress HEENT: normocephalic CV: normal rate Pulm/chest wall: normal effort Breast Exam: deferred Abdomen: soft Neuro: alert and oriented x 3 Skin: warm, dry Psych: affect normal Pelvic exam:  Bimanual exam: Cervix 0/long/high, firm, anterior, neg CMT, uterus mildly tender, nonenlarged, adnexa without tenderness, enlargement, or mass  Assessment & Plan:  1. Chronic pelvic pain in female --Exam wnl today --Plan to evaluate for STI and BV, treat PRN --Continue new depression medication which may improve symptoms --FU in 3 months  - Cervicovaginal ancillary only( [place]) - POCT Urinalysis Dipstick  2. Screen for STD (sexually transmitted disease)  - Cervicovaginal ancillary only( [place]) - 12/02/2021 PELVIC COMPLETE WITH TRANSVAGINAL; Future - HIV antibody (with reflex) - RPR -  Hepatitis B Surface AntiGEN - Hepatitis C Antibody   Return in about 3 months (around 03/07/2022) for Gyn follow up with me.   03/09/2022, CNM 6:08 PM

## 2021-12-06 LAB — CERVICOVAGINAL ANCILLARY ONLY
Bacterial Vaginitis (gardnerella): POSITIVE — AB
Candida Glabrata: NEGATIVE
Candida Vaginitis: POSITIVE — AB
Chlamydia: NEGATIVE
Comment: NEGATIVE
Comment: NEGATIVE
Comment: NEGATIVE
Comment: NEGATIVE
Comment: NEGATIVE
Comment: NORMAL
Neisseria Gonorrhea: NEGATIVE
Trichomonas: NEGATIVE

## 2021-12-06 MED ORDER — METRONIDAZOLE 500 MG PO TABS: 500.0000 mg | ORAL_TABLET | Freq: Two times a day (BID) | ORAL | 0 refills | Status: AC

## 2021-12-06 MED ORDER — FLUCONAZOLE 150 MG PO TABS: 150.0000 mg | ORAL_TABLET | Freq: Once | ORAL | 1 refills | Status: AC

## 2021-12-06 NOTE — Addendum Note (Signed)
Addended by: [name] A on: 12/06/2021 07:14 PM   Modules accepted: Orders

## 2021-12-07 ENCOUNTER — Encounter: Payer: Self-pay | Admitting: Advanced Practice Midwife

## 2021-12-07 LAB — RPR, QUANT+TP ABS (REFLEX)
Rapid Plasma Reagin, Quant: 1:2 {titer} — ABNORMAL HIGH
T Pallidum Abs: NONREACTIVE

## 2021-12-07 LAB — HEPATITIS B SURFACE ANTIGEN: Hepatitis B Surface Ag: NEGATIVE

## 2021-12-07 LAB — RPR: RPR Ser Ql: REACTIVE — AB

## 2021-12-07 LAB — HEPATITIS C ANTIBODY: Hep C Virus Ab: NONREACTIVE

## 2021-12-07 LAB — HIV ANTIBODY (ROUTINE TESTING W REFLEX): HIV Screen 4th Generation wRfx: NONREACTIVE

## 2021-12-08 ENCOUNTER — Encounter: Payer: Self-pay | Admitting: Emergency Medicine

## 2021-12-08 ENCOUNTER — Other Ambulatory Visit: Payer: Self-pay | Admitting: Emergency Medicine

## 2021-12-08 DIAGNOSIS — B001 Herpesviral vesicular dermatitis: Secondary | ICD-10-CM

## 2021-12-08 MED ORDER — VALACYCLOVIR HCL 1 G PO TABS: 1000.0000 mg | ORAL_TABLET | Freq: Two times a day (BID) | ORAL | 3 refills | Status: DC

## 2021-12-08 NOTE — Progress Notes (Signed)
Per [name], verbal order for cold sore dose.  Valtrex 2000 mg Q 12 hours x2 doses ( one day treatment) with 3 refills.

## 2021-12-09 ENCOUNTER — Other Ambulatory Visit: Payer: Self-pay | Admitting: Emergency Medicine

## 2021-12-09 DIAGNOSIS — B001 Herpesviral vesicular dermatitis: Secondary | ICD-10-CM

## 2021-12-09 MED ORDER — VALACYCLOVIR HCL 1 G PO TABS: ORAL_TABLET | ORAL | 3 refills | Status: DC

## 2021-12-09 NOTE — Progress Notes (Signed)
Rx reordered.  

## 2021-12-14 ENCOUNTER — Ambulatory Visit
Admission: RE | Admit: 2021-12-14 | Discharge: 2021-12-14 | Disposition: A | Discharge status: Home / Self Care | Payer: BC Managed Care – PPO | Source: Ambulatory Visit | Attending: Advanced Practice Midwife | Admitting: Advanced Practice Midwife

## 2021-12-14 DIAGNOSIS — N941 Unspecified dyspareunia: Secondary | ICD-10-CM | POA: Diagnosis not present

## 2021-12-14 DIAGNOSIS — R102 Pelvic and perineal pain: Secondary | ICD-10-CM | POA: Diagnosis not present

## 2021-12-14 DIAGNOSIS — Z113 Encounter for screening for infections with a predominantly sexual mode of transmission: Secondary | ICD-10-CM | POA: Diagnosis not present

## 2021-12-26 ENCOUNTER — Other Ambulatory Visit: Payer: BC Managed Care – PPO

## 2021-12-28 ENCOUNTER — Other Ambulatory Visit: Payer: BC Managed Care – PPO

## 2021-12-28 DIAGNOSIS — Z113 Encounter for screening for infections with a predominantly sexual mode of transmission: Secondary | ICD-10-CM

## 2021-12-30 LAB — RPR, QUANT+TP ABS (REFLEX)
Rapid Plasma Reagin, Quant: 1:2 {titer} — ABNORMAL HIGH
T Pallidum Abs: NONREACTIVE

## 2021-12-30 LAB — RPR: RPR Ser Ql: REACTIVE — AB

## 2022-01-07 ENCOUNTER — Encounter (HOSPITAL_BASED_OUTPATIENT_CLINIC_OR_DEPARTMENT_OTHER): Payer: Self-pay | Admitting: Advanced Practice Midwife

## 2022-01-07 DIAGNOSIS — R768 Other specified abnormal immunological findings in serum: Secondary | ICD-10-CM | POA: Insufficient documentation

## 2022-02-14 DIAGNOSIS — H6992 Unspecified Eustachian tube disorder, left ear: Secondary | ICD-10-CM | POA: Diagnosis not present

## 2022-02-14 DIAGNOSIS — R360 Urethral discharge without blood: Secondary | ICD-10-CM | POA: Diagnosis not present

## 2022-02-14 DIAGNOSIS — R399 Unspecified symptoms and signs involving the genitourinary system: Secondary | ICD-10-CM | POA: Diagnosis not present

## 2022-03-03 DIAGNOSIS — Z13220 Encounter for screening for lipoid disorders: Secondary | ICD-10-CM | POA: Diagnosis not present

## 2022-03-03 DIAGNOSIS — Z0000 Encounter for general adult medical examination without abnormal findings: Secondary | ICD-10-CM | POA: Diagnosis not present

## 2022-03-07 ENCOUNTER — Ambulatory Visit: Payer: BC Managed Care – PPO | Admitting: Advanced Practice Midwife

## 2022-03-07 NOTE — Progress Notes (Deleted)
   GYNECOLOGY PROGRESS NOTE  History:  [name] presents to [facility] *** office today for problem gyn visit. She reports *****.  She denies h/a, dizziness, shortness of breath, n/v, or fever/chills.    The following portions of the patient's history were reviewed and updated as appropriate: allergies, current medications, past family history, past medical history, past social history, past surgical history and problem list. Last pap smear on *** was normal, *** HRHPV.  Health Maintenance Due  Topic Date Due   COVID-19 Vaccine (1) Never done   TETANUS/TDAP  Never done   INFLUENZA VACCINE  Never done     Review of Systems:  Pertinent items are noted in HPI.   Objective:  Physical Exam There were no vitals taken for this visit. VS reviewed, nursing note reviewed,  Constitutional: well developed, well nourished, no distress HEENT: normocephalic CV: normal rate Pulm/chest wall: normal effort Breast Exam: deferred Abdomen: soft Neuro: alert and oriented x 3 Skin: warm, dry Psych: affect normal Pelvic exam: Cervix pink, visually closed, without lesion, scant Heindl creamy discharge, vaginal walls and external genitalia normal Bimanual exam: Cervix 0/long/high, firm, anterior, neg CMT, uterus nontender, nonenlarged, adnexa without tenderness, enlargement, or mass  Assessment & Plan:  There are no diagnoses linked to this encounter.  No follow-ups on file.   Vickie Morales, CNM 8:59 AM

## 2022-05-05 ENCOUNTER — Ambulatory Visit (INDEPENDENT_AMBULATORY_CARE_PROVIDER_SITE_OTHER): Payer: BC Managed Care – PPO | Admitting: Obstetrics

## 2022-05-05 ENCOUNTER — Encounter: Payer: Self-pay | Admitting: Obstetrics

## 2022-05-05 ENCOUNTER — Other Ambulatory Visit (HOSPITAL_COMMUNITY)
Admission: RE | Admit: 2022-05-05 | Discharge: 2022-05-05 | Disposition: A | Discharge status: Home / Self Care | Payer: BC Managed Care – PPO | Source: Ambulatory Visit | Attending: Obstetrics | Admitting: Obstetrics

## 2022-05-05 VITALS — BP 116/75 | HR 100 | Ht 63.0 in | Wt 232.2 lb

## 2022-05-05 DIAGNOSIS — N898 Other specified noninflammatory disorders of vagina: Secondary | ICD-10-CM | POA: Insufficient documentation

## 2022-05-05 DIAGNOSIS — B9689 Other specified bacterial agents as the cause of diseases classified elsewhere: Secondary | ICD-10-CM

## 2022-05-05 DIAGNOSIS — N760 Acute vaginitis: Secondary | ICD-10-CM

## 2022-05-05 DIAGNOSIS — Z3009 Encounter for other general counseling and advice on contraception: Secondary | ICD-10-CM | POA: Diagnosis not present

## 2022-05-05 DIAGNOSIS — B379 Candidiasis, unspecified: Secondary | ICD-10-CM | POA: Diagnosis not present

## 2022-05-05 DIAGNOSIS — Z30011 Encounter for initial prescription of contraceptive pills: Secondary | ICD-10-CM

## 2022-05-05 MED ORDER — NORGESTIMATE-ETH ESTRADIOL 0.25-35 MG-MCG PO TABS: 1.0000 | ORAL_TABLET | Freq: Every day | ORAL | 11 refills | Status: DC

## 2022-05-05 MED ORDER — FLUCONAZOLE 200 MG PO TABS: 200.0000 mg | ORAL_TABLET | ORAL | 2 refills | Status: DC

## 2022-05-05 MED ORDER — METRONIDAZOLE 500 MG PO TABS: 500.0000 mg | ORAL_TABLET | Freq: Two times a day (BID) | ORAL | 2 refills | Status: DC

## 2022-05-05 NOTE — Progress Notes (Signed)
Presents to change from patch to pills. Also endorses vaginal discharge, odor, vaginal itching.

## 2022-05-05 NOTE — Progress Notes (Signed)
Patient ID: Vickie Morales, female   DOB: 20-Mar-1996, 26 y.o.   MRN: [id number]  Chief Complaint  Patient presents with   Vaginal Discharge    HPI Vickie Morales is a 26 y.o. female.  Complains of vaginal discharge with itching and odor. HPI  Past Medical History:  Diagnosis Date   Allergy    Anemia    Asthma    Headache     Past Surgical History:  Procedure Laterality Date   MOUTH SURGERY     TONSILLECTOMY AND ADENOIDECTOMY Bilateral 2011    Family History  Problem Relation Age of Onset   Migraines Mother    ADD / ADHD Mother    Depression Mother    Anxiety disorder Mother    Alcohol abuse Father    Migraines Maternal Grandmother    Asthma Maternal Grandfather     Social History Social History   Tobacco Use   Smoking status: Never   Smokeless tobacco: Never  Vaping Use   Vaping Use: Never used  Substance Use Topics   Alcohol use: Yes    Comment: once every other week   Drug use: No    Allergies  Allergen Reactions   Hydrocodone Swelling    Current Outpatient Medications  Medication Sig Dispense Refill   fluconazole (DIFLUCAN) 200 MG tablet Take 1 tablet (200 mg total) by mouth every 3 (three) days. 3 tablet 2   metroNIDAZOLE (FLAGYL) 500 MG tablet Take 1 tablet (500 mg total) by mouth 2 (two) times daily. 14 tablet 2   norgestimate-ethinyl estradiol (ORTHO-CYCLEN) 0.25-35 MG-MCG tablet Take 1 tablet by mouth daily. 28 tablet 11   benzonatate (TESSALON) 100 MG capsule Take 1 capsule (100 mg total) by mouth every 8 (eight) hours. (Patient not taking: Reported on 07/05/2020) 21 capsule 0   cetirizine (ZYRTEC ALLERGY) 10 MG tablet Take 1 tablet (10 mg total) by mouth daily. (Patient not taking: Reported on 07/05/2020) 30 tablet 0   escitalopram (LEXAPRO) 10 MG tablet Take 30 mg by mouth every other day. (Patient not taking: Reported on 07/29/2021)     fluticasone (FLONASE) 50 MCG/ACT nasal spray Place 1 spray into both nostrils daily. (Patient not taking:  Reported on 07/05/2020) 16 g 0   phentermine 37.5 MG capsule Take 1 capsule (37.5 mg total) by mouth every morning. (Patient not taking: Reported on 07/29/2021) 30 capsule 2   valACYclovir (VALTREX) 1000 MG tablet Take 2 tablets every 12 hours for 1 day. (Patient not taking: Reported on 05/05/2022) 4 tablet 3   No current facility-administered medications for this visit.    Review of Systems Review of Systems Constitutional: negative for fatigue and weight loss Respiratory: negative for cough and wheezing Cardiovascular: negative for chest pain, fatigue and palpitations Gastrointestinal: negative for abdominal pain and change in bowel habits Genitourinary:positive for vaginal discharge with itching and odor Integument/breast: negative for nipple discharge Musculoskeletal:negative for myalgias Neurological: negative for gait problems and tremors Behavioral/Psych: negative for abusive relationship, depression Endocrine: negative for temperature intolerance      Blood pressure 116/75, pulse 100, height 5\' 3"  (1.6 m), weight 232 lb 3.2 oz (105.3 kg).  Physical Exam Physical Exam General:   Alert and no distress  Skin:   no rash or abnormalities  Lungs:   clear to auscultation bilaterally  Heart:   regular rate and rhythm, S1, S2 normal, no murmur, click, rub or gallop  Breasts:   normal without suspicious masses, skin or nipple changes or axillary nodes  Abdomen:  normal findings: no organomegaly, soft, non-tender and no hernia  Pelvis:  External genitalia: normal general appearance Urinary system: urethral meatus normal and bladder without fullness, nontender Vaginal: normal without tenderness, induration or masses Cervix: normal appearance Adnexa: normal bimanual exam Uterus: anteverted and non-tender, normal size    I have spent a total of 20 minutes of face-to-face time, excluding clinical staff time, reviewing notes and preparing to see patient, ordering tests and/or medications,  and counseling the patient.   Data Reviewed Wet Prep  Assessment     1. Vaginal discharge Rx: - Cervicovaginal ancillary only( [place])  2. BV (bacterial vaginosis) Rx: - metroNIDAZOLE (FLAGYL) 500 MG tablet; Take 1 tablet (500 mg total) by mouth 2 (two) times daily.  Dispense: 14 tablet; Refill: 2  3. Candida albicans infection Rx: - fluconazole (DIFLUCAN) 200 MG tablet; Take 1 tablet (200 mg total) by mouth every 3 (three) days.  Dispense: 3 tablet; Refill: 2  4. Encounter for other general counseling and advice on contraception - wants to discontinue Xulane Patch because of poor adhesion - wants to start OCP's  5. Encounter for initial prescription of contraceptive pills Rx - norgestimate-ethinyl estradiol (ORTHO-CYCLEN) 0.25-35 MG-MCG tablet; Take 1 tablet by mouth daily.  Dispense: 28 tablet; Refill: 11     Plan   Follow up in 5 months  Meds ordered this encounter  Medications   norgestimate-ethinyl estradiol (ORTHO-CYCLEN) 0.25-35 MG-MCG tablet    Sig: Take 1 tablet by mouth daily.    Dispense:  28 tablet    Refill:  11   fluconazole (DIFLUCAN) 200 MG tablet    Sig: Take 1 tablet (200 mg total) by mouth every 3 (three) days.    Dispense:  3 tablet    Refill:  2   metroNIDAZOLE (FLAGYL) 500 MG tablet    Sig: Take 1 tablet (500 mg total) by mouth 2 (two) times daily.    Dispense:  14 tablet    Refill:  2     Brock Bad, MD 05/05/2022 10:37 AM

## 2022-05-09 ENCOUNTER — Other Ambulatory Visit: Payer: Self-pay | Admitting: Obstetrics

## 2022-05-09 LAB — CERVICOVAGINAL ANCILLARY ONLY
Bacterial Vaginitis (gardnerella): NEGATIVE
Candida Glabrata: NEGATIVE
Candida Vaginitis: POSITIVE — AB
Chlamydia: NEGATIVE
Comment: NEGATIVE
Comment: NEGATIVE
Comment: NEGATIVE
Comment: NEGATIVE
Comment: NEGATIVE
Comment: NORMAL
Neisseria Gonorrhea: NEGATIVE
Trichomonas: NEGATIVE

## 2022-05-10 ENCOUNTER — Encounter: Payer: Self-pay | Admitting: Emergency Medicine

## 2022-07-27 ENCOUNTER — Other Ambulatory Visit: Payer: Self-pay | Admitting: Obstetrics

## 2022-07-27 DIAGNOSIS — Z30011 Encounter for initial prescription of contraceptive pills: Secondary | ICD-10-CM

## 2022-08-30 ENCOUNTER — Other Ambulatory Visit: Payer: Self-pay | Admitting: Obstetrics

## 2022-08-30 DIAGNOSIS — Z30011 Encounter for initial prescription of contraceptive pills: Secondary | ICD-10-CM

## 2022-08-30 MED ORDER — NORGESTIMATE-ETH ESTRADIOL 0.25-35 MG-MCG PO TABS: 1.0000 | ORAL_TABLET | Freq: Every day | ORAL | 11 refills | Status: DC

## 2022-09-11 ENCOUNTER — Ambulatory Visit
Admission: EM | Admit: 2022-09-11 | Discharge: 2022-09-11 | Disposition: A | Discharge status: Home / Self Care | Payer: BC Managed Care – PPO | Attending: Internal Medicine | Admitting: Internal Medicine

## 2022-09-11 ENCOUNTER — Other Ambulatory Visit: Payer: Self-pay

## 2022-09-11 ENCOUNTER — Ambulatory Visit (INDEPENDENT_AMBULATORY_CARE_PROVIDER_SITE_OTHER): Payer: BC Managed Care – PPO

## 2022-09-11 DIAGNOSIS — J4521 Mild intermittent asthma with (acute) exacerbation: Secondary | ICD-10-CM

## 2022-09-11 DIAGNOSIS — R0602 Shortness of breath: Secondary | ICD-10-CM | POA: Diagnosis not present

## 2022-09-11 MED ORDER — ALBUTEROL SULFATE (2.5 MG/3ML) 0.083% IN NEBU
2.5000 mg | INHALATION_SOLUTION | Freq: Once | RESPIRATORY_TRACT | Status: AC
  Administered 2022-09-11: 2.5 mg via RESPIRATORY_TRACT

## 2022-09-11 MED ORDER — ALBUTEROL SULFATE HFA 108 (90 BASE) MCG/ACT IN AERS: 1.0000 | INHALATION_SPRAY | Freq: Four times a day (QID) | RESPIRATORY_TRACT | 0 refills | Status: DC | PRN

## 2022-09-11 MED ORDER — PREDNISONE 20 MG PO TABS: 40.0000 mg | ORAL_TABLET | Freq: Every day | ORAL | 0 refills | Status: AC

## 2022-09-11 NOTE — ED Provider Notes (Signed)
[facility]    CSN: [id number] Arrival date & time: 09/11/22  1356      History   Chief Complaint Chief Complaint  Patient presents with   Shortness of Breath    HPI Vickie Morales is a 27 y.o. female.   Patient presents with shortness of breath that started yesterday.  Reports that she had a similar episode in October but it resolved on its own after about a week.  She does have some nasal congestion but reports it has been present for multiple months and has not changed.  Denies any new symptoms or new known sick contacts.  Reports history of asthma when she was a child but no complications since.  Patient not reporting any chest pain.   Shortness of Breath   Past Medical History:  Diagnosis Date   Allergy    Anemia    Asthma    Headache     Patient Active Problem List   Diagnosis Date Noted   Biological false positive RPR test 01/07/2022   Tension headache, chronic 04/23/2015    Past Surgical History:  Procedure Laterality Date   MOUTH SURGERY     TONSILLECTOMY AND ADENOIDECTOMY Bilateral 2011    OB History     Gravida  0   Para  0   Term  0   Preterm  0   AB  0   Living  0      SAB  0   IAB  0   Ectopic  0   Multiple  0   Live Births  0            Home Medications    Prior to Admission medications   Medication Sig Start Date End Date Taking? Authorizing Provider  albuterol (VENTOLIN HFA) 108 (90 Base) MCG/ACT inhaler Inhale 1-2 puffs into the lungs every 6 (six) hours as needed for wheezing or shortness of breath. 09/11/22  Yes Tidus Upchurch, Randall E, FNP  predniSONE (DELTASONE) 20 MG tablet Take 2 tablets (40 mg total) by mouth daily for 5 days. 09/11/22 09/16/22 Yes Julita Ozbun, Acie Fredrickson, FNP  benzonatate (TESSALON) 100 MG capsule Take 1 capsule (100 mg total) by mouth every 8 (eight) hours. Patient not taking: Reported on 07/05/2020 06/03/20   Hall-Potvin, [place], PA-C  cetirizine (ZYRTEC ALLERGY) 10 MG tablet Take 1 tablet (10 mg  total) by mouth daily. Patient not taking: Reported on 07/05/2020 06/03/20   Hall-Potvin, [place], PA-C  escitalopram (LEXAPRO) 10 MG tablet Take 30 mg by mouth every other day. Patient not taking: Reported on 07/29/2021 04/23/19   [provider]  fluconazole (DIFLUCAN) 200 MG tablet Take 1 tablet (200 mg total) by mouth every 3 (three) days. 05/05/22   Brock Bad, MD  fluticasone (FLONASE) 50 MCG/ACT nasal spray Place 1 spray into both nostrils daily. Patient not taking: Reported on 07/05/2020 06/03/20   Hall-Potvin, [place], PA-C  metroNIDAZOLE (FLAGYL) 500 MG tablet Take 1 tablet (500 mg total) by mouth 2 (two) times daily. 05/05/22   Brock Bad, MD  norgestimate-ethinyl estradiol (SPRINTEC 28) 0.25-35 MG-MCG tablet Take 1 tablet by mouth daily. 08/30/22   Brock Bad, MD  phentermine 37.5 MG capsule Take 1 capsule (37.5 mg total) by mouth every morning. Patient not taking: Reported on 07/29/2021 07/05/20   Brock Bad, MD  valACYclovir (VALTREX) 1000 MG tablet Take 2 tablets every 12 hours for 1 day. Patient not taking: Reported on 05/05/2022 12/09/21   Nettie Elm  L, MD    Family History Family History  Problem Relation Age of Onset   Migraines Mother    ADD / ADHD Mother    Depression Mother    Anxiety disorder Mother    Alcohol abuse Father    Migraines Maternal Grandmother    Asthma Maternal Grandfather     Social History Social History   Tobacco Use   Smoking status: Never   Smokeless tobacco: Never  Vaping Use   Vaping Use: Never used  Substance Use Topics   Alcohol use: Yes    Comment: once every other week   Drug use: No     Allergies   Hydrocodone   Review of Systems Review of Systems Per HPI  Physical Exam Triage Vital Signs ED Triage Vitals [09/11/22 1410]  Enc Vitals Group     BP (!) 140/80     Pulse Rate 100     Resp 18     Temp 98 F (36.7 C)     Temp Source Oral     SpO2 98 %     Weight      Height       Head Circumference      Peak Flow      Pain Score 0     Pain Loc      Pain Edu?      Excl. in GC?    No data found.  Updated Vital Signs BP (!) 140/80 (BP Location: Left Arm)   Pulse 100   Temp 98 F (36.7 C) (Oral)   Resp 18   SpO2 98%   Visual Acuity Right Eye Distance:   Left Eye Distance:   Bilateral Distance:    Right Eye Near:   Left Eye Near:    Bilateral Near:     Physical Exam Constitutional:      General: She is not in acute distress.    Appearance: Normal appearance. She is not toxic-appearing or diaphoretic.  HENT:     Head: Normocephalic and atraumatic.     Right Ear: Tympanic membrane and ear canal normal.     Left Ear: Tympanic membrane and ear canal normal.     Nose: Congestion present.     Mouth/Throat:     Mouth: Mucous membranes are moist.     Pharynx: No posterior oropharyngeal erythema.  Eyes:     Extraocular Movements: Extraocular movements intact.     Conjunctiva/sclera: Conjunctivae normal.     Pupils: Pupils are equal, round, and reactive to light.  Cardiovascular:     Rate and Rhythm: Normal rate and regular rhythm.     Pulses: Normal pulses.     Heart sounds: Normal heart sounds.  Pulmonary:     Effort: Pulmonary effort is normal. No respiratory distress.     Breath sounds: No stridor. Wheezing present. No rhonchi or rales.     Comments: Wheezing noted bilaterally to auscultation. Abdominal:     General: Abdomen is flat. Bowel sounds are normal.     Palpations: Abdomen is soft.  Musculoskeletal:        General: Normal range of motion.     Cervical back: Normal range of motion.  Skin:    General: Skin is warm and dry.  Neurological:     General: No focal deficit present.     Mental Status: She is alert and oriented to person, place, and time. Mental status is at baseline.  Psychiatric:        Mood  and Affect: Mood normal.        Behavior: Behavior normal.      UC Treatments / Results  Labs (all labs ordered are listed,  but only abnormal results are displayed) Labs Reviewed - No data to display  EKG   Radiology DG Chest 2 View  Result Date: 09/11/2022 CLINICAL DATA:  Provided history: Shortness of breath. EXAM: CHEST - 2 VIEW COMPARISON:  Prior chest radiographs 04/24/2019 and earlier FINDINGS: Heart size within normal limits. No appreciable airspace consolidation. No evidence of pleural effusion or pneumothorax. No acute osseous abnormality identified. IMPRESSION: No evidence of active cardiopulmonary disease. Electronically Signed   By: Jackey Loge D.O.   On: 09/11/2022 14:58    Procedures Procedures (including critical care time)  Medications Ordered in UC Medications  albuterol (PROVENTIL) (2.5 MG/3ML) 0.083% nebulizer solution 2.5 mg (2.5 mg Nebulization Given 09/11/22 1423)    Initial Impression / Assessment and Plan / UC Course  I have reviewed the triage vital signs and the nursing notes.  Pertinent labs & imaging results that were available during my care of the patient were reviewed by me and considered in my medical decision making (see chart for details).     Suspect asthma exacerbation that is mild.  Oxygen is normal and no tachypnea so do not think that emergent evaluation is necessary.  Chest x-ray was completed that was negative for any acute cardiopulmonary process.  Albuterol nebulizer treatment administered with improvement in lung sounds and patient stating that she felt much better.  Therefore, will send albuterol inhaler for patient to take as needed and prednisone steroid to decrease inflammation.  Advised her to follow-up if any symptoms persist or worsen and strict ER precautions.  Patient verbalized understanding and was agreeable with plan. Final Clinical Impressions(s) / UC Diagnoses   Final diagnoses:  Mild intermittent asthma with acute exacerbation     Discharge Instructions      Chest x-ray was normal.  Suspect asthma exacerbation.  I have prescribed an albuterol  inhaler and prednisone to help treat this.  Follow-up if any symptoms persist or worsen.     ED Prescriptions     Medication Sig Dispense Auth. Provider   albuterol (VENTOLIN HFA) 108 (90 Base) MCG/ACT inhaler Inhale 1-2 puffs into the lungs every 6 (six) hours as needed for wheezing or shortness of breath. 1 each West, Rolly Salter E, [place]   predniSONE (DELTASONE) 20 MG tablet Take 2 tablets (40 mg total) by mouth daily for 5 days. 10 tablet Gustavus Bryant, [place]      PDMP not reviewed this encounter.   Gustavus Bryant, [place] 09/11/22 [phone]

## 2022-09-11 NOTE — Discharge Instructions (Signed)
Chest x-ray was normal.  Suspect asthma exacerbation.  I have prescribed an albuterol inhaler and prednisone to help treat this.  Follow-up if any symptoms persist or worsen.

## 2022-09-11 NOTE — ED Triage Notes (Signed)
Pt c/o sob onset ~ last night. Says it got worse this morning.

## 2022-09-25 ENCOUNTER — Other Ambulatory Visit: Payer: Self-pay | Admitting: Obstetrics

## 2022-09-25 DIAGNOSIS — Z30011 Encounter for initial prescription of contraceptive pills: Secondary | ICD-10-CM

## 2022-10-24 ENCOUNTER — Other Ambulatory Visit: Payer: Self-pay | Admitting: Obstetrics

## 2022-10-24 DIAGNOSIS — Z30011 Encounter for initial prescription of contraceptive pills: Secondary | ICD-10-CM

## 2022-11-07 ENCOUNTER — Ambulatory Visit (INDEPENDENT_AMBULATORY_CARE_PROVIDER_SITE_OTHER): Payer: BC Managed Care – PPO | Admitting: Emergency Medicine

## 2022-11-07 DIAGNOSIS — R300 Dysuria: Secondary | ICD-10-CM | POA: Diagnosis not present

## 2022-11-07 LAB — POCT URINALYSIS DIPSTICK
Bilirubin, UA: NEGATIVE
Blood, UA: NEGATIVE
Glucose, UA: NEGATIVE
Ketones, UA: POSITIVE
Nitrite, UA: NEGATIVE
Protein, UA: NEGATIVE
Spec Grav, UA: 1.015 (ref 1.010–1.025)
Urobilinogen, UA: 0.2 E.U./dL
pH, UA: 6 (ref 5.0–8.0)

## 2022-11-07 NOTE — Progress Notes (Signed)
SUBJECTIVE: Vickie Morales is a 27 y.o. female who complains of urinary frequency, urgency and dysuria x 3 days, without flank pain, fever, chills, or abnormal vaginal discharge or bleeding.   OBJECTIVE: Appears well, in no apparent distress.  Vital signs are normal. Urine dipstick shows positive for WBC's, positive for RBC's, positive for nitrates, and positive for ketones.    ASSESSMENT: Dysuria  PLAN: Treatment per orders.  Call or return to clinic prn if these symptoms worsen or fail to improve as anticipated.

## 2022-11-10 ENCOUNTER — Telehealth: Payer: Self-pay

## 2022-11-10 DIAGNOSIS — R300 Dysuria: Secondary | ICD-10-CM

## 2022-11-10 MED ORDER — NITROFURANTOIN MONOHYD MACRO 100 MG PO CAPS: 100.0000 mg | ORAL_CAPSULE | Freq: Two times a day (BID) | ORAL | 0 refills | Status: AC

## 2022-11-10 MED ORDER — PHENAZOPYRIDINE HCL 200 MG PO TABS: 200.0000 mg | ORAL_TABLET | Freq: Three times a day (TID) | ORAL | 0 refills | Status: AC | PRN

## 2022-11-10 NOTE — Telephone Encounter (Signed)
Pt called checking on urine culture results. Results have not resulted yet but pt c/o dysuria, and frequency.  Pyridium offered and sent to pharmacy per protocol and  Macrobid until urine culture results.

## 2022-11-14 LAB — URINE CULTURE

## 2022-11-15 ENCOUNTER — Telehealth: Payer: Self-pay

## 2022-11-15 DIAGNOSIS — B379 Candidiasis, unspecified: Secondary | ICD-10-CM

## 2022-11-15 MED ORDER — FLUCONAZOLE 150 MG PO TABS: 150.0000 mg | ORAL_TABLET | ORAL | 3 refills | Status: DC

## 2022-11-15 NOTE — Telephone Encounter (Signed)
Patient called stating she has a yeast infection due to the anabiotics she was on to treat the UTI.  Rx sent.

## 2022-11-22 ENCOUNTER — Ambulatory Visit (INDEPENDENT_AMBULATORY_CARE_PROVIDER_SITE_OTHER): Payer: BC Managed Care – PPO | Admitting: Emergency Medicine

## 2022-11-22 VITALS — BP 116/80 | HR 72 | Wt 232.0 lb

## 2022-11-22 DIAGNOSIS — R300 Dysuria: Secondary | ICD-10-CM

## 2022-11-22 LAB — POCT URINALYSIS DIPSTICK
Bilirubin, UA: NEGATIVE
Glucose, UA: NEGATIVE
Ketones, UA: NEGATIVE
Nitrite, UA: POSITIVE
Protein, UA: NEGATIVE
Spec Grav, UA: >=1.03 — AB (ref 1.010–1.025)
Urobilinogen, UA: 0.2 E.U./dL
pH, UA: 7 (ref 5.0–8.0)

## 2022-11-22 NOTE — Progress Notes (Signed)
SUBJECTIVE: Vickie Morales is a 27 y.o. female who complains of urinary frequency, urgency and dysuria x 3 days, without flank pain, fever, chills, or abnormal vaginal discharge or bleeding. Recently treated with Macrobid for E.coli UTI, had minimal relief.  OBJECTIVE: Appears well, in no apparent distress.  Vital signs are normal. Urine dipstick shows positive for RBC's and positive for leukocytes.    ASSESSMENT: Dysuria  PLAN: Treatment per orders.  Call or return to clinic prn if these symptoms worsen or fail to improve as anticipated.

## 2022-11-25 LAB — URINE CULTURE

## 2022-11-27 ENCOUNTER — Other Ambulatory Visit: Payer: Self-pay | Admitting: Obstetrics and Gynecology

## 2022-11-27 MED ORDER — SULFAMETHOXAZOLE-TRIMETHOPRIM 800-160 MG PO TABS: 1.0000 | ORAL_TABLET | Freq: Two times a day (BID) | ORAL | 0 refills | Status: AC

## 2023-01-10 ENCOUNTER — Ambulatory Visit (INDEPENDENT_AMBULATORY_CARE_PROVIDER_SITE_OTHER): Payer: BC Managed Care – PPO | Admitting: Emergency Medicine

## 2023-01-10 DIAGNOSIS — R300 Dysuria: Secondary | ICD-10-CM | POA: Diagnosis not present

## 2023-01-10 LAB — POCT URINALYSIS DIPSTICK
Bilirubin, UA: POSITIVE
Blood, UA: NEGATIVE
Glucose, UA: NEGATIVE
Ketones, UA: POSITIVE
Nitrite, UA: NEGATIVE
Protein, UA: POSITIVE — AB
Spec Grav, UA: 1.01 (ref 1.010–1.025)
Urobilinogen, UA: 0.2 E.U./dL
pH, UA: 5 (ref 5.0–8.0)

## 2023-01-10 MED ORDER — PHENAZOPYRIDINE HCL 200 MG PO TABS: 200.0000 mg | ORAL_TABLET | Freq: Three times a day (TID) | ORAL | 0 refills | Status: DC | PRN

## 2023-01-10 NOTE — Progress Notes (Signed)
SUBJECTIVE: Vickie Morales is a 27 y.o. female who complains of urinary frequency, urgency and dysuria x 2 weeks, without flank pain, fever, chills, or abnormal vaginal discharge or bleeding.  Patient left urine, did not see RN.  OBJECTIVE: Appears well, in no apparent distress.  Vital signs are normal. Urine dipstick shows positive for nitrates and positive for leukocytes.    ASSESSMENT: Dysuria  PLAN: Treatment per orders.  Call or return to clinic prn if these symptoms worsen or fail to improve as anticipated.

## 2023-01-13 LAB — URINE CULTURE

## 2023-01-15 DIAGNOSIS — R300 Dysuria: Secondary | ICD-10-CM | POA: Diagnosis not present

## 2023-01-15 DIAGNOSIS — N3000 Acute cystitis without hematuria: Secondary | ICD-10-CM | POA: Diagnosis not present

## 2023-01-16 ENCOUNTER — Other Ambulatory Visit: Payer: Self-pay

## 2023-01-16 DIAGNOSIS — N390 Urinary tract infection, site not specified: Secondary | ICD-10-CM

## 2023-01-16 MED ORDER — SULFAMETHOXAZOLE-TRIMETHOPRIM 800-160 MG PO TABS: 1.0000 | ORAL_TABLET | Freq: Two times a day (BID) | ORAL | 1 refills | Status: DC

## 2023-02-26 ENCOUNTER — Other Ambulatory Visit (HOSPITAL_COMMUNITY)
Admission: RE | Admit: 2023-02-26 | Discharge: 2023-02-26 | Disposition: A | Discharge status: Home / Self Care | Payer: BC Managed Care – PPO | Source: Ambulatory Visit | Attending: Advanced Practice Midwife | Admitting: Advanced Practice Midwife

## 2023-02-26 ENCOUNTER — Encounter: Payer: Self-pay | Admitting: Advanced Practice Midwife

## 2023-02-26 ENCOUNTER — Ambulatory Visit (INDEPENDENT_AMBULATORY_CARE_PROVIDER_SITE_OTHER): Payer: BC Managed Care – PPO | Admitting: Advanced Practice Midwife

## 2023-02-26 VITALS — BP 113/72 | HR 95 | Ht 63.0 in | Wt 199.8 lb

## 2023-02-26 DIAGNOSIS — Z3041 Encounter for surveillance of contraceptive pills: Secondary | ICD-10-CM

## 2023-02-26 DIAGNOSIS — R87610 Atypical squamous cells of undetermined significance on cytologic smear of cervix (ASC-US): Secondary | ICD-10-CM

## 2023-02-26 DIAGNOSIS — Z3009 Encounter for other general counseling and advice on contraception: Secondary | ICD-10-CM

## 2023-02-26 DIAGNOSIS — F520 Hypoactive sexual desire disorder: Secondary | ICD-10-CM

## 2023-02-26 DIAGNOSIS — N390 Urinary tract infection, site not specified: Secondary | ICD-10-CM | POA: Diagnosis not present

## 2023-02-26 MED ORDER — NORETHIN ACE-ETH ESTRAD-FE 1-20 MG-MCG PO TABS: 1.0000 | ORAL_TABLET | Freq: Every day | ORAL | 11 refills | Status: AC

## 2023-02-26 NOTE — Progress Notes (Signed)
   GYNECOLOGY PROGRESS NOTE  History:  27 y.o. G0P0000 presents to [facility] office today for problem gyn visit. She reports frequent UTIs in the last year.  She reports ~ 5 UTIs since June of 2023.  They clear up with abx but return quickly.  Urine culture is usually positive for E coli.  Pt is doing preventive measures, wiping from front to back, urinating after intercourse, using unscented soap, etc. She reports she could drink more water. She also continues to have low libido and is concerned that OCPs are affecting that.  She is taking Sprintec daily. Menses prior to OCPs were regular and heavy. She denies h/a, dizziness, shortness of breath, n/v, or fever/chills.    The following portions of the patient's history were reviewed and updated as appropriate: allergies, current medications, past family history, past medical history, past social history, past surgical history and problem list. Last pap smear on 09/01/21 was abnormal with ASCUS and neg HPV.  Health Maintenance Due  Topic Date Due   DTaP/Tdap/Td (1 - Tdap) Never done   INFLUENZA VACCINE  Never done   COVID-19 Vaccine (1 - 2023-24 season) Never done     Review of Systems:  Pertinent items are noted in HPI.   Objective:  Physical Exam Blood pressure 113/72, pulse 95, height 5\' 3"  (1.6 m), weight 199 lb 12.8 oz (90.6 kg), last menstrual period 02/15/2023. VS reviewed, nursing note reviewed,  Constitutional: well developed, well nourished, no distress HEENT: normocephalic CV: normal rate Pulm/chest wall: normal effort Breast Exam: deferred Abdomen: soft Neuro: alert and oriented x 3 Skin: warm, dry Psych: affect normal Pelvic exam: Cervix pink, visually closed, without lesion, scant Harvie creamy discharge, vaginal walls and external genitalia normal Bimanual exam: Cervix 0/long/high, firm, anterior, neg CMT, uterus nontender, nonenlarged, adnexa without tenderness, enlargement, or mass  Assessment & Plan:  1. ASCUS of  cervix with negative high risk HPV --Repeat Pap today - Cytology - PAP( [place])  2. Frequent UTI --Urine culture today --Add probiotic daily to preventive regimen --Discussed OCPs as sometimes affecting UTI, also diabetes as risk factor. --Urine culture today --See contraceptive counseling - Hemoglobin A1c  3. Hypoactive sexual desire --Pt has tried Wellbutrin without improvement --Discussed nonhormonal and low hormone birth control options, pt to consider --See below for contraceptive counseling -F/U in 3 months  4. Encounter for counseling regarding contraception --Discussed pt contraceptive plans and reviewed contraceptive methods based on pt preferences and effectiveness.  Pt prefers  to try lower dose of estrogen to see if UTI frequency decreases.  Considering IUD (Mirena vs Paragard) at later time.  - norethindrone-ethinyl estradiol-FE (JUNEL FE 1/20) 1-20 MG-MCG tablet; Take 1 tablet by mouth daily.  Dispense: 28 tablet; Refill: 11    Return in about 3 months (around 05/29/2023) for Gyn/contraceptive visit with me.   [name], CNM 12:05 PM

## 2023-02-26 NOTE — Progress Notes (Signed)
Pt presents for recurrent UTI. Pt reports 5 UTI's this year.   Last PAP 09-01-21, ASC-US Requesting PAP. Declines STD testing

## 2023-02-27 LAB — HEMOGLOBIN A1C
Est. average glucose Bld gHb Est-mCnc: 114 mg/dL
Hgb A1c MFr Bld: 5.6 % (ref 4.8–5.6)

## 2023-03-01 DIAGNOSIS — R87610 Atypical squamous cells of undetermined significance on cytologic smear of cervix (ASC-US): Secondary | ICD-10-CM | POA: Insufficient documentation

## 2023-03-01 LAB — CYTOLOGY - PAP
Comment: NEGATIVE
Diagnosis: UNDETERMINED — AB
High risk HPV: NEGATIVE

## 2023-03-01 LAB — URINE CULTURE

## 2023-03-01 MED ORDER — SULFAMETHOXAZOLE-TRIMETHOPRIM 800-160 MG PO TABS: 1.0000 | ORAL_TABLET | Freq: Two times a day (BID) | ORAL | 0 refills | Status: AC

## 2023-03-01 NOTE — Addendum Note (Signed)
Addended by: [name] A on: 03/01/2023 09:46 PM   Modules accepted: Orders

## 2023-03-06 DIAGNOSIS — D509 Iron deficiency anemia, unspecified: Secondary | ICD-10-CM | POA: Diagnosis not present

## 2023-03-06 DIAGNOSIS — Z13220 Encounter for screening for lipoid disorders: Secondary | ICD-10-CM | POA: Diagnosis not present

## 2023-03-06 DIAGNOSIS — Z0000 Encounter for general adult medical examination without abnormal findings: Secondary | ICD-10-CM | POA: Diagnosis not present

## 2023-04-05 DIAGNOSIS — M654 Radial styloid tenosynovitis [de Quervain]: Secondary | ICD-10-CM | POA: Diagnosis not present

## 2023-04-05 DIAGNOSIS — M67833 Other specified disorders of tendon, right wrist: Secondary | ICD-10-CM | POA: Diagnosis not present

## 2023-04-19 DIAGNOSIS — M654 Radial styloid tenosynovitis [de Quervain]: Secondary | ICD-10-CM | POA: Diagnosis not present

## 2023-04-19 DIAGNOSIS — D509 Iron deficiency anemia, unspecified: Secondary | ICD-10-CM | POA: Diagnosis not present

## 2023-05-30 ENCOUNTER — Other Ambulatory Visit: Payer: Self-pay | Admitting: *Deleted

## 2023-05-30 DIAGNOSIS — B9689 Other specified bacterial agents as the cause of diseases classified elsewhere: Secondary | ICD-10-CM

## 2023-05-30 MED ORDER — METRONIDAZOLE 500 MG PO TABS: 500.0000 mg | ORAL_TABLET | Freq: Two times a day (BID) | ORAL | 0 refills | Status: DC

## 2023-05-30 MED ORDER — FLUCONAZOLE 150 MG PO TABS: 150.0000 mg | ORAL_TABLET | Freq: Once | ORAL | 0 refills | Status: AC

## 2023-05-30 NOTE — Progress Notes (Signed)
 Pt called to office with symptoms of BV.  Pt also request Diflucan  to use after completion due to yeast associated with abx use. Flagyl  and Diflucan  sent today.

## 2023-07-02 DIAGNOSIS — R350 Frequency of micturition: Secondary | ICD-10-CM | POA: Diagnosis not present

## 2023-07-02 DIAGNOSIS — N3000 Acute cystitis without hematuria: Secondary | ICD-10-CM | POA: Diagnosis not present

## 2023-09-27 ENCOUNTER — Encounter: Payer: Self-pay | Admitting: Plastic Surgery

## 2023-09-27 ENCOUNTER — Ambulatory Visit (INDEPENDENT_AMBULATORY_CARE_PROVIDER_SITE_OTHER): Admitting: Plastic Surgery

## 2023-09-27 VITALS — BP 108/76 | HR 89 | Ht 63.0 in | Wt 200.4 lb

## 2023-09-27 DIAGNOSIS — N62 Hypertrophy of breast: Secondary | ICD-10-CM | POA: Diagnosis not present

## 2023-09-27 DIAGNOSIS — M546 Pain in thoracic spine: Secondary | ICD-10-CM | POA: Diagnosis not present

## 2023-09-27 DIAGNOSIS — R21 Rash and other nonspecific skin eruption: Secondary | ICD-10-CM | POA: Diagnosis not present

## 2023-09-27 NOTE — Progress Notes (Signed)
 Referring Provider Sun, Vyvyan, MD [phone] [place],  [place] [address]   CC:  Chief Complaint  Patient presents with   Advice Only      Vickie Morales is an 28 y.o. female.  HPI: [name] is a 28 year old female who presents today with complaints of upper back and neck pain of approximately 2 years duration which she attributes to her breast size.  She states that her breast pull her forward and pull on her chest wall and cause back pain.  Her bras are difficult to find that fit appropriately.  They do cause grooving in her shoulders.  She occasionally has rashes on the posterior aspect of her breast especially when she is sweating.  She would like to have surgical reduction in the size of her breast.  Allergies  Allergen Reactions   Hydrocodone Swelling    Outpatient Encounter Medications as of 09/27/2023  Medication Sig   norethindrone-ethinyl estradiol -FE (JUNEL FE 1/20) 1-20 MG-MCG tablet Take 1 tablet by mouth daily.   [DISCONTINUED] benzonatate  (TESSALON ) 100 MG capsule Take 1 capsule (100 mg total) by mouth every 8 (eight) hours. (Patient not taking: Reported on 09/27/2023)   [DISCONTINUED] escitalopram (LEXAPRO) 10 MG tablet Take 30 mg by mouth every other day. (Patient not taking: Reported on 07/29/2021)   [DISCONTINUED] fluticasone  (FLONASE ) 50 MCG/ACT nasal spray Place 1 spray into both nostrils daily. (Patient not taking: Reported on 09/27/2023)   [DISCONTINUED] metroNIDAZOLE  (FLAGYL ) 500 MG tablet Take 1 tablet (500 mg total) by mouth 2 (two) times daily. (Patient not taking: Reported on 09/27/2023)   [DISCONTINUED] Semaglutide-Weight Management 0.25 MG/0.5ML SOAJ Inject 0.25 mg into the skin. (Patient not taking: Reported on 09/27/2023)   No facility-administered encounter medications on file as of 09/27/2023.     Past Medical History:  Diagnosis Date   Allergy    Anemia    Asthma    Headache     Past Surgical History:  Procedure Laterality Date    MOUTH SURGERY     TONSILLECTOMY AND ADENOIDECTOMY Bilateral 2011    Family History  Problem Relation Age of Onset   Migraines Mother    ADD / ADHD Mother    Depression Mother    Anxiety disorder Mother    Alcohol abuse Father    Migraines Maternal Grandmother    Asthma Maternal Grandfather     Social History   Social History Narrative   [name] is in twelfth grade at [employer]. She is doing well.    Living with her mother. She has three adult aged sisters that do not live in the home.    Dad was hospitalized for alcohol abuse. Mother has Attention Deficit Disorder.   HC: 56.2 cm     Review of Systems General: Denies fevers, chills, weight loss CV: Denies chest pain, shortness of breath, palpitations Breast: Large breasts which contribute to her upper back and neck pain.  No other specific breast complaints  Physical Exam    09/27/2023   10:48 AM 02/26/2023   11:12 AM 11/22/2022    4:23 PM  Vitals with BMI  Height 5\' 3"  5\' 3"    Weight 200 lbs 6 oz 199 lbs 13 oz 232 lbs  BMI 35.51 35.4   Systolic 108 113 409  Diastolic 76 72 80  Pulse 89 95 72    General:  No acute distress,  Alert and oriented, Non-Toxic, Normal speech and affect Breast: Patient has large pendulous  breast with grade 3 ptosis.  No dominant masses on physical exam and the nipples are normal in appearance without evidence of nipple discharge today.  Her sternal notch nipple distance on the right is 36 cm and 36 cm on the left her fold and nipple distance on the right is 19 cm and 19 cm on the left Mammogram: Not applicable due to age Assessment/Plan Symptomatic macromastia: Patient has large breasts and would likely benefit from a bilateral breast reduction.  I believe that I can remove 700 g per breast.  I discussed breast reduction at length with the patient.  I showed her the location of the incisions and we discussed the unpredictable nature of scarring and wound healing.  We  discussed the risk of bleeding, infection, and seroma formation she understands that occasionally I will use drains but that decision will be made in the operating room.  We discussed the risk of nipple loss due to nipple ischemia.  We discussed the possibility that she will not be able to breast-feed successfully after breast reduction.  We discussed the fact that mammograms can sometimes be difficult to interpret after breast reduction.  Operative limitations including no heavy lifting greater than 20 pounds, no vigorous activity, no submerging incisions in water for 6 weeks.  She understands she will need to wear a supportive compressive garment for 6 weeks.  She may return to light activity as tolerated and she will be encouraged to begin ambulation the day of surgery to help decrease DVT.  All questions were answered to her satisfaction.  Photographs were obtained today with her consent.  Will submit her for a bilateral breast reduction and for physical therapy at her request.  Teretha Ferguson 09/27/2023, 11:20 AM

## 2023-10-13 ENCOUNTER — Other Ambulatory Visit: Payer: Self-pay

## 2023-10-13 ENCOUNTER — Ambulatory Visit: Attending: Plastic Surgery

## 2023-10-13 DIAGNOSIS — N62 Hypertrophy of breast: Secondary | ICD-10-CM | POA: Insufficient documentation

## 2023-10-13 DIAGNOSIS — M542 Cervicalgia: Secondary | ICD-10-CM | POA: Insufficient documentation

## 2023-10-13 DIAGNOSIS — R293 Abnormal posture: Secondary | ICD-10-CM | POA: Insufficient documentation

## 2023-10-13 DIAGNOSIS — M546 Pain in thoracic spine: Secondary | ICD-10-CM | POA: Insufficient documentation

## 2023-10-13 NOTE — Therapy (Signed)
 OUTPATIENT PHYSICAL THERAPY EVALUATION   Patient Name: Vickie Morales MRN: [id number] DOB:28-Mar-1996, 28 y.o., female Today's Date: 10/13/2023  END OF SESSION:  PT End of Session - 10/13/23 1044     Visit Number 2    Number of Visits 8    Date for PT Re-Evaluation 04/06/20    Authorization Type BCBS    PT Start Time 1030    PT Stop Time 1105    PT Time Calculation (min) 35 min    Activity Tolerance Patient tolerated treatment well    Behavior During Therapy Vickie Morales for tasks assessed/performed             Past Medical History:  Diagnosis Date   Allergy    Anemia    Asthma    Headache    Past Surgical History:  Procedure Laterality Date   MOUTH SURGERY     TONSILLECTOMY AND ADENOIDECTOMY Bilateral 2011   Patient Active Problem List   Diagnosis Date Noted   ASCUS of cervix with negative high risk HPV 03/01/2023   Oral contraceptive use 02/26/2023   Biological false positive RPR test 01/07/2022   Tension headache, chronic 04/23/2015    PCP: Morales, Vyvyan, MD  REFERRING PROVIDER: Teretha Ferguson, MD   REFERRING DIAG:  N62 (ICD-10-CM) - Hypertrophy of breast  M54.6 (ICD-10-CM) - Thoracic back pain, unspecified back pain laterality, unspecified chronicity  Eval. and Treat Neck and Back pain due to mammary Hypertrophy. Insurance requires 6 weeks of treatment.    Rationale for Evaluation and Treatment: Rehabilitation  THERAPY DIAG:  Cervicalgia  Pain in thoracic spine  Abnormal posture  ONSET DATE: 09/27/2023 date of referral   SUBJECTIVE:                                                                                                                                                                                           SUBJECTIVE STATEMENT: Patient reports to PT with history of neck and upper back pain for a couple of years, that she is planning to have breast reduction procedure for this year. She would like to make sure that she is strong enough for  surgery. She has difficulty with strenuous activity. She feels that her bra straps are digging into her shoulder. She is unable to jump d/t pain. She is walking, and going to the gym to work on cardio (treadmill) and core exercises. She has not been incorporating UE or LE strengthening exercises.  PERTINENT HISTORY:  PMHx of migraines   PAIN:  Are you having pain?  Yes: NPRS scale: 5/10 current, 8/10 worst  Pain location: Upper back>Neck  Pain description:  aching,  Aggravating factors: lifting, reaching, bending, breathing, strenous activity Relieving factors: ibuprofen    PRECAUTIONS: None  RED FLAGS: None   WEIGHT BEARING RESTRICTIONS: No  FALLS:  Has patient fallen in last 6 months? No  LIVING ENVIRONMENT: Lives with: lives with their family Lives in: House/apartment Stairs: Yes   OCCUPATION: Vet Tech  PLOF: Independent  PATIENT GOALS: To be strong enough for surgery   NEXT MD VISIT: 12/25/2023 Mammoplasty Reduction Procedure   OBJECTIVE:  Note: Objective measures were completed at Evaluation unless otherwise noted.  DIAGNOSTIC FINDINGS:  09/11/22  "CLINICAL DATA:  Provided history: Shortness of breath.   EXAM: CHEST - 2 VIEW   COMPARISON:  Prior chest radiographs 04/24/2019 and earlier   FINDINGS: Heart size within normal limits. No appreciable airspace consolidation. No evidence of pleural effusion or pneumothorax. No acute osseous abnormality identified.   IMPRESSION: No evidence of active cardiopulmonary disease."      PATIENT SURVEYS:   Patient Specific Functional Scale:  Activity Eval   Bend down (to pick something up) without pain  4   Wear clothes comfortably 3   Breathe comfortably 6   Strenuous Activity 3   Average 4    (Activities rated 0-10/10.  "10" represents "able to perform at prior level" while "0" represents "unable to perform." )   COGNITION: Overall cognitive status: Within functional limits for tasks  assessed     SENSATION: Not tested   POSTURE: rounded shoulders and forward head  PALPATION: Moderate tenderness to palpation along BIL UT, and periscapular mm    CERVICAL ROM:   Active ROM A/PROM (deg) eval  Flexion WFL  Extension WFL  Right lateral flexion WFL  Left lateral flexion WFL  Right rotation WFL  Left rotation [facility]  Thoracic Flexion 50%  Thoracic Extension 75%   (Blank rows = not tested)    UPPER EXTREMITY MMT:  MMT Right eval Left eval  Shoulder flexion 4- 4-  Shoulder extension    Shoulder abduction 4- 4-  Shoulder adduction    Shoulder extension    Shoulder internal rotation 4+ 4+  Shoulder external rotation 4- 4-  Middle trapezius 4- 4-  Lower trapezius 4- 4-  Elbow flexion 4+ 4+  Elbow extension 4- 4-  Wrist flexion    Wrist extension    Wrist ulnar deviation    Wrist radial deviation    Wrist pronation    Wrist supination    Grip strength     (Blank rows = not tested)     TREATMENT DATE:   [facility] Adult PT Treatment:                                                DATE: 10/13/2023   Initial evaluation: see patient education and home exercise program as noted below  PATIENT EDUCATION:  Education details: reviewed initial home exercise program; discussion of POC, prognosis and goals for skilled PT   Person educated: Patient Education method: Explanation, Demonstration, and Handouts Education comprehension: verbalized understanding, returned demonstration, and needs further education  HOME EXERCISE PROGRAM: Access Code: [email or web address] URL: [email or web address] Date: 10/13/2023 Prepared by: Vickie Morales  Exercises - Standing Cervical Retraction  - 1 x daily - 7 x weekly - 2 sets - 10 reps - 3 sec hold hold - Gentle Levator Scapulae Stretch  - 1 x daily - 7 x weekly - 2-3 reps - 20-30 sec  hold - Seated Scapular Retraction  - 1 x daily - 7 x weekly - 2 sets - 10 reps - 3 sec hold - Prone Scapular Retraction Arms at Side  - 1 x daily - 7 x weekly - 2 sets - 10 reps - Prone Scapular Retraction Y  - 1 x daily - 7 x weekly - 2 sets - 10 reps  ASSESSMENT:  CLINICAL IMPRESSION: Vickie Morales is a 28 y.o. female who was seen today for physical therapy evaluation and treatment for Neck and Thoracic Pain related to referring dx of hypertrophy of breast. She is demonstrating decreased UQ MMT, decreased TS mobility, and decreased postural endurance. She has related pain and difficulty with lifting, taking deep breaths, performing strenuous activities. She requires skilled PT services at this time to address relevant deficits and improve overall function.     OBJECTIVE IMPAIRMENTS: decreased activity tolerance, decreased ROM, decreased strength, postural dysfunction, and pain.   ACTIVITY LIMITATIONS: carrying, lifting, bending, and reach over head  PARTICIPATION LIMITATIONS: meal prep, cleaning, laundry, community activity, and occupation  PERSONAL FACTORS: Time since onset of injury/illness/exacerbation are also affecting patient's functional outcome.   REHAB POTENTIAL: Good  CLINICAL DECISION MAKING: Stable/uncomplicated  EVALUATION COMPLEXITY: Low   GOALS: Goals reviewed with patient? YES  SHORT TERM GOALS: Target date: 11/10/2023   Patient will be independent with initial home program at least 3 days/week.  Baseline: provided at eval Goal Status: INITIAL   2.  Patient will demonstrate improved postural awareness for at least 15 minutes while seated without need for cueing from PT.  Baseline: see objective measures Goal Status: INITIAL    LONG TERM GOALS: Target date: 12/08/2023    Patient will report improved overall functional ability with PSFS average score of 6 or greater.  Baseline: avg 4 Goal Status: INITIAL    2.  Patient will demonstrate improved BIL UQ  strength to at least 4+/5 MMT  Baseline: see objective measures Goal status: INITIAL  3.  Patient will demonstrate ability to perform floor to waist lifting of at least 25# using appropriate body mechanics and with no more than minimal pain in order to safely perform normal daily/occupational tasks.   Baseline: limited d/t pain with bending   Goal Status: INITIAL   4.  Patient will demonstrate ability to perform overhead lifting of at least 10# using appropriate body mechanics and with no more than minimal pain in order to safely perform normal daily/occupational tasks.   Baseline: limited d/t pain with overhead reaching  Goal Status: INITIAL   PLAN:  PT FREQUENCY: 1x/week  PT DURATION: 8 weeks  PLANNED INTERVENTIONS: 97164- PT Re-evaluation, 97110-Therapeutic exercises, 97530- Therapeutic activity, 97112- Neuromuscular re-education, 97535- Self Care, 09811- Manual therapy, [id number]- Electrical stimulation (unattended), Patient/Family education, Taping, Dry Needling, Joint mobilization, Spinal manipulation, Spinal mobilization, Cryotherapy, and Moist heat.  PLAN FOR NEXT SESSION: cervicothoracic mobility, periscapular strengthening, UBE, other interventions  for pain modulation as indicated, patient education as indicated    Vickie Morales, PT, DPT  10/13/2023 11:17 AM

## 2023-10-26 NOTE — Therapy (Signed)
 OUTPATIENT PHYSICAL THERAPY TREATMENT   Patient Name: KENSLY BOWMER MRN: [id number] DOB:1995/09/10, 28 y.o., female Today's Date: 10/27/2023  END OF SESSION:  PT End of Session - 10/27/23 0813     Visit Number 3    Number of Visits 8    Date for PT Re-Evaluation 11/10/23   updated to match cert order   Authorization Type [employer]    PT Start Time 0815    PT Stop Time 0853    PT Time Calculation (min) 38 min    Activity Tolerance Patient tolerated treatment well           Past Medical History:  Diagnosis Date   Allergy    Anemia    Asthma    Headache    Past Surgical History:  Procedure Laterality Date   MOUTH SURGERY     TONSILLECTOMY AND ADENOIDECTOMY Bilateral 2011   Patient Active Problem List   Diagnosis Date Noted   ASCUS of cervix with negative high risk HPV 03/01/2023   Oral contraceptive use 02/26/2023   Biological false positive RPR test 01/07/2022   Tension headache, chronic 04/23/2015    PCP: Sun, Vyvyan, MD  REFERRING PROVIDER: Teretha Ferguson, MD   REFERRING DIAG:  N62 (ICD-10-CM) - Hypertrophy of breast  M54.6 (ICD-10-CM) - Thoracic back pain, unspecified back pain laterality, unspecified chronicity  Eval. and Treat Neck and Back pain due to mammary Hypertrophy. Insurance requires 6 weeks of treatment.    Rationale for Evaluation and Treatment: Rehabilitation  THERAPY DIAG:  Cervicalgia  Pain in thoracic spine  Abnormal posture  ONSET DATE: 09/27/2023 date of referral   SUBJECTIVE:                                                                                                                                                                                          Per eval - Patient reports to PT with history of neck and upper back pain for a couple of years, that she is planning to have breast reduction procedure for this year. She would like to make sure that she is strong enough for surgery. She has difficulty with strenuous activity.  She feels that her bra straps are digging into her shoulder. She is unable to jump d/t pain. She is walking, and going to the gym to work on cardio (treadmill) and core exercises. She has not been incorporating UE or LE strengthening exercises.  SUBJECTIVE STATEMENT: 10/27/2023: states she has been doing well, has been practicing HEP more often than not and feels less tension. No issues  PERTINENT HISTORY:  PMHx of migraines   PAIN:  Are you  having pain?  Yes: NPRS scale: 5/10 current, 8/10 worst  Pain location: Upper back>Neck  Pain description: aching,  Aggravating factors: lifting, reaching, bending, breathing, strenous activity Relieving factors: ibuprofen    PRECAUTIONS: None  RED FLAGS: None   WEIGHT BEARING RESTRICTIONS: No  FALLS:  Has patient fallen in last 6 months? No  LIVING ENVIRONMENT: Lives with: lives with their family Lives in: House/apartment Stairs: Yes   OCCUPATION: Vet Tech  PLOF: Independent  PATIENT GOALS: To be strong enough for surgery   NEXT MD VISIT: 12/25/2023 Mammoplasty Reduction Procedure   OBJECTIVE:  Note: Objective measures were completed at Evaluation unless otherwise noted.  DIAGNOSTIC FINDINGS:  09/11/22  CLINICAL DATA:  Provided history: Shortness of breath.   EXAM: CHEST - 2 VIEW   COMPARISON:  Prior chest radiographs 04/24/2019 and earlier   FINDINGS: Heart size within normal limits. No appreciable airspace consolidation. No evidence of pleural effusion or pneumothorax. No acute osseous abnormality identified.   IMPRESSION: No evidence of active cardiopulmonary disease.      PATIENT SURVEYS:   Patient Specific Functional Scale:  Activity Eval   Bend down (to pick something up) without pain  4   Wear clothes comfortably 3   Breathe comfortably 6   Strenuous Activity 3   Average 4    (Activities rated 0-10/10.  10 represents able to perform at prior level" while 0 represents "unable to perform."  )   COGNITION: Overall cognitive status: Within functional limits for tasks assessed     SENSATION: Not tested   POSTURE: rounded shoulders and forward head  PALPATION: Moderate tenderness to palpation along BIL UT, and periscapular mm    CERVICAL ROM:   Active ROM A/PROM (deg) eval  Flexion WFL  Extension WFL  Right lateral flexion WFL  Left lateral flexion WFL  Right rotation WFL  Left rotation [facility]  Thoracic Flexion 50%  Thoracic Extension 75%   (Blank rows = not tested)    UPPER EXTREMITY MMT:  MMT Right eval Left eval  Shoulder flexion 4- 4-  Shoulder extension    Shoulder abduction 4- 4-  Shoulder adduction    Shoulder extension    Shoulder internal rotation 4+ 4+  Shoulder external rotation 4- 4-  Middle trapezius 4- 4-  Lower trapezius 4- 4-  Elbow flexion 4+ 4+  Elbow extension 4- 4-  Wrist flexion    Wrist extension    Wrist ulnar deviation    Wrist radial deviation    Wrist pronation    Wrist supination    Grip strength     (Blank rows = not tested)     TREATMENT DATE:  [facility] Adult PT Treatment:                                                DATE: 10/27/23 Therapeutic Exercise: Scap retraction x10 Seated chin tuck x10 LS stretch 2x30sec BIL Red band row 2x10 w/ 3-5 sec iso hold  Seated foam roll thoracic extension x8 arms crossed Seated foam roll thoracic ext hands behind head x8  HEP update + education  Neuromuscular re-ed: Prone Y x10 Prone T x 10  Double ER + scap retraction red band 2x10 cues for posture Red band at wrist double scaption 2x8 at wall cues for posture/pacing and appropriate tension  PATIENT EDUCATION:  Education details: rationale for interventions, HEP  Person educated: Patient Education method: Explanation, Demonstration, Tactile cues, Verbal cues Education comprehension:  verbalized understanding, returned demonstration, verbal cues required, tactile cues required, and needs further education     HOME EXERCISE PROGRAM: Access Code: [email or web address] URL: [email or web address] Date: 10/27/2023 Prepared by: Mayme Spearman  Exercises - Standing Cervical Retraction  - 1 x daily - 7 x weekly - 2 sets - 10 reps - 3 sec hold hold - Gentle Levator Scapulae Stretch  - 1 x daily - 7 x weekly - 2-3 reps - 20-30 sec hold - Prone Scapular Retraction Y  - 1 x daily - 7 x weekly - 2 sets - 10 reps - Shoulder External Rotation and Scapular Retraction with Resistance  - 1 x daily - 7 x weekly - 2 sets - 8 reps - Standing Shoulder Row with Anchored Resistance  - 1 x daily - 7 x weekly - 2 sets - 8 reps  ASSESSMENT:  CLINICAL IMPRESSION: 10/27/2023: Pt arrives w/o pain, reports good response to HEP and does well with review today. Progressing today for introduction of resistance to periscapular training w/ iso hold to build postural stability/endurance. No adverse events, tolerates session well without increase in pain. Cues as above. Recommend continuing along current POC in order to address relevant deficits and improve functional tolerance. Pt departs today's session in no acute distress, all voiced questions/concerns addressed appropriately from PT perspective.     Per eval - Latha is a 28 y.o. female who was seen today for physical therapy evaluation and treatment for Neck and Thoracic Pain related to referring dx of hypertrophy of breast. She is demonstrating decreased UQ MMT, decreased TS mobility, and decreased postural endurance. She has related pain and difficulty with lifting, taking deep breaths, performing strenuous activities. She requires skilled PT services at this time to address relevant deficits and improve overall function.     OBJECTIVE IMPAIRMENTS: decreased activity tolerance, decreased ROM, decreased strength, postural dysfunction, and pain.    ACTIVITY LIMITATIONS: carrying, lifting, bending, and reach over head  PARTICIPATION LIMITATIONS: meal prep, cleaning, laundry, community activity, and occupation  PERSONAL FACTORS: Time since onset of injury/illness/exacerbation are also affecting patient's functional outcome.   REHAB POTENTIAL: Good  CLINICAL DECISION MAKING: Stable/uncomplicated  EVALUATION COMPLEXITY: Low   GOALS: Goals reviewed with patient? YES  SHORT TERM GOALS: Target date: 11/10/2023   Patient will be independent with initial home program at least 3 days/week.  Baseline: provided at eval Goal Status: INITIAL   2.  Patient will demonstrate improved postural awareness for at least 15 minutes while seated without need for cueing from PT.  Baseline: see objective measures Goal Status: INITIAL    LONG TERM GOALS: Target date: 12/08/2023    Patient will report improved overall functional ability with PSFS average score of 6 or greater.  Baseline: avg 4 Goal Status: INITIAL    2.  Patient will demonstrate improved BIL UQ strength to at least 4+/5 MMT  Baseline: see objective measures Goal status: INITIAL  3.  Patient will demonstrate ability to perform floor to waist lifting of at least 25# using appropriate body mechanics and with no more than minimal pain in order to safely perform normal daily/occupational tasks.   Baseline: limited d/t pain with bending   Goal Status: INITIAL   4.  Patient will demonstrate ability to perform overhead lifting of at least 10# using appropriate body mechanics and with no more than minimal pain  in order to safely perform normal daily/occupational tasks.   Baseline: limited d/t pain with overhead reaching  Goal Status: INITIAL   PLAN:  PT FREQUENCY: 1x/week  PT DURATION: 8 weeks  PLANNED INTERVENTIONS: 97164- PT Re-evaluation, 97110-Therapeutic exercises, 97530- Therapeutic activity, 97112- Neuromuscular re-education, 97535- Self Care, 16109- Manual therapy,  [id number]- Electrical stimulation (unattended), Patient/Family education, Taping, Dry Needling, Joint mobilization, Spinal manipulation, Spinal mobilization, Cryotherapy, and Moist heat.  PLAN FOR NEXT SESSION: cervicothoracic mobility, periscapular strengthening, UBE, other interventions for pain modulation as indicated, patient education as indicated    Lovett Ruck PT, DPT 10/27/2023 8:57 AM

## 2023-10-27 ENCOUNTER — Encounter: Payer: Self-pay | Admitting: Physical Therapy

## 2023-10-27 ENCOUNTER — Ambulatory Visit: Attending: Plastic Surgery | Admitting: Physical Therapy

## 2023-10-27 DIAGNOSIS — M546 Pain in thoracic spine: Secondary | ICD-10-CM | POA: Insufficient documentation

## 2023-10-27 DIAGNOSIS — R293 Abnormal posture: Secondary | ICD-10-CM | POA: Insufficient documentation

## 2023-10-27 DIAGNOSIS — M542 Cervicalgia: Secondary | ICD-10-CM | POA: Insufficient documentation

## 2023-10-29 DIAGNOSIS — M654 Radial styloid tenosynovitis [de Quervain]: Secondary | ICD-10-CM | POA: Diagnosis not present

## 2023-11-03 ENCOUNTER — Ambulatory Visit

## 2023-11-03 DIAGNOSIS — M542 Cervicalgia: Secondary | ICD-10-CM

## 2023-11-03 DIAGNOSIS — M546 Pain in thoracic spine: Secondary | ICD-10-CM | POA: Diagnosis not present

## 2023-11-03 DIAGNOSIS — R293 Abnormal posture: Secondary | ICD-10-CM

## 2023-11-03 NOTE — Therapy (Signed)
 OUTPATIENT PHYSICAL THERAPY TREATMENT   Patient Name: Vickie Morales MRN: [id number] DOB:27-Jul-1995, 28 y.o., female Today's Date: 11/03/2023  END OF SESSION:  PT End of Session - 11/03/23 1017     Visit Number 4    Number of Visits 8    Date for PT Re-Evaluation 11/10/23   updated to match cert order   Authorization Type [employer]    PT Start Time [phone]    PT Stop Time 1027    PT Time Calculation (min) 40 min    Activity Tolerance Patient tolerated treatment well    Behavior During Therapy [facility] for tasks assessed/performed            Past Medical History:  Diagnosis Date   Allergy    Anemia    Asthma    Headache    Past Surgical History:  Procedure Laterality Date   MOUTH SURGERY     TONSILLECTOMY AND ADENOIDECTOMY Bilateral 2011   Patient Active Problem List   Diagnosis Date Noted   ASCUS of cervix with negative high risk HPV 03/01/2023   Oral contraceptive use 02/26/2023   Biological false positive RPR test 01/07/2022   Tension headache, chronic 04/23/2015    PCP: Sun, Vyvyan, MD  REFERRING PROVIDER: Waddell Leonce NOVAK, MD   REFERRING DIAG:  N62 (ICD-10-CM) - Hypertrophy of breast  M54.6 (ICD-10-CM) - Thoracic back pain, unspecified back pain laterality, unspecified chronicity  Eval. and Treat Neck and Back pain due to mammary Hypertrophy. Insurance requires 6 weeks of treatment.    Rationale for Evaluation and Treatment: Rehabilitation  THERAPY DIAG:  Cervicalgia  Pain in thoracic spine  Abnormal posture  ONSET DATE: 09/27/2023 date of referral   SUBJECTIVE:                                                                                                                                                                                          Per eval - Patient reports to PT with history of neck and upper back pain for a couple of years, that she is planning to have breast reduction procedure for this year. She would like to make sure that she is strong  enough for surgery. She has difficulty with strenuous activity. She feels that her bra straps are digging into her shoulder. She is unable to jump d/t pain. She is walking, and going to the gym to work on cardio (treadmill) and core exercises. She has not been incorporating UE or LE strengthening exercises.  SUBJECTIVE STATEMENT: 11/03/2023: patient reports that her pain overall seems to be doing better. States it's overall less intense but that she still feels a  low dull ache throughout the day  PERTINENT HISTORY:  PMHx of migraines   PAIN:  Are you having pain?  Yes: NPRS scale: 5/10 current, 8/10 worst  Pain location: Upper back>Neck  Pain description: aching,  Aggravating factors: lifting, reaching, bending, breathing, strenous activity Relieving factors: ibuprofen    PRECAUTIONS: None  RED FLAGS: None   WEIGHT BEARING RESTRICTIONS: No  FALLS:  Has patient fallen in last 6 months? No  LIVING ENVIRONMENT: Lives with: lives with their family Lives in: House/apartment Stairs: Yes   OCCUPATION: Vet Tech  PLOF: Independent  PATIENT GOALS: To be strong enough for surgery   NEXT MD VISIT: 12/25/2023 Mammoplasty Reduction Procedure   OBJECTIVE:  Note: Objective measures were completed at Evaluation unless otherwise noted.  DIAGNOSTIC FINDINGS:  09/11/22  CLINICAL DATA:  Provided history: Shortness of breath.   EXAM: CHEST - 2 VIEW   COMPARISON:  Prior chest radiographs 04/24/2019 and earlier   FINDINGS: Heart size within normal limits. No appreciable airspace consolidation. No evidence of pleural effusion or pneumothorax. No acute osseous abnormality identified.   IMPRESSION: No evidence of active cardiopulmonary disease.      PATIENT SURVEYS:   Patient Specific Functional Scale:  Activity Eval   Bend down (to pick something up) without pain  4   Wear clothes comfortably 3   Breathe comfortably 6   Strenuous Activity 3   Average 4    (Activities rated  0-10/10.  10 represents able to perform at prior level" while 0 represents "unable to perform." )   COGNITION: Overall cognitive status: Within functional limits for tasks assessed     SENSATION: Not tested   POSTURE: rounded shoulders and forward head  PALPATION: Moderate tenderness to palpation along BIL UT, and periscapular mm    CERVICAL ROM:   Active ROM A/PROM (deg) eval  Flexion WFL  Extension WFL  Right lateral flexion WFL  Left lateral flexion WFL  Right rotation WFL  Left rotation [facility]  Thoracic Flexion 50%  Thoracic Extension 75%   (Blank rows = not tested)    UPPER EXTREMITY MMT:  MMT Right eval Left eval  Shoulder flexion 4- 4-  Shoulder extension    Shoulder abduction 4- 4-  Shoulder adduction    Shoulder extension    Shoulder internal rotation 4+ 4+  Shoulder external rotation 4- 4-  Middle trapezius 4- 4-  Lower trapezius 4- 4-  Elbow flexion 4+ 4+  Elbow extension 4- 4-  Wrist flexion    Wrist extension    Wrist ulnar deviation    Wrist radial deviation    Wrist pronation    Wrist supination    Grip strength     (Blank rows = not tested)     TREATMENT DATE:  [facility] Adult PT Treatment:                                                DATE:  11/03/2023 Therapeutic Exercise: 3 way ball roll outs x3 minutes 2x10 single arm punch ups 8lbs Seated foam roll thoracic extension x8 arms crossed Seated foam roll thoracic ext hands behind head x8   Self STM with tennis ball to rhomboids x3 minutes Standing thoracic rotation with ball x15 reps  Neuromuscular Re-ed 2x10 RTB cheerleaders for postural control 3x10 RTB serratus scoops 3x8 cable column rows 13lbs 3x30 second bus drivers  with RTB    10/27/23 Therapeutic Exercise: Scap retraction x10 Seated chin tuck x10 LS stretch 2x30sec BIL Red band row 2x10 w/ 3-5 sec iso hold  Seated foam roll thoracic extension x8 arms crossed Seated foam roll thoracic ext hands behind head x8  HEP  update + education  Neuromuscular re-ed: Prone Y x10 Prone T x 10  Double ER + scap retraction red band 2x10 cues for posture Red band at wrist double scaption 2x8 at wall cues for posture/pacing and appropriate tension                                                                                                                                   PATIENT EDUCATION:  Education details: rationale for interventions, HEP  Person educated: Patient Education method: Explanation, Demonstration, Tactile cues, Verbal cues Education comprehension: verbalized understanding, returned demonstration, verbal cues required, tactile cues required, and needs further education     HOME EXERCISE PROGRAM: Access Code: [email or web address] URL: [email or web address] Date: 10/27/2023 Prepared by: Alm Jenny  Exercises - Standing Cervical Retraction  - 1 x daily - 7 x weekly - 2 sets - 10 reps - 3 sec hold hold - Gentle Levator Scapulae Stretch  - 1 x daily - 7 x weekly - 2-3 reps - 20-30 sec hold - Prone Scapular Retraction Y  - 1 x daily - 7 x weekly - 2 sets - 10 reps - Shoulder External Rotation and Scapular Retraction with Resistance  - 1 x daily - 7 x weekly - 2 sets - 8 reps - Standing Shoulder Row with Anchored Resistance  - 1 x daily - 7 x weekly - 2 sets - 8 reps  ASSESSMENT:  CLINICAL IMPRESSION: 11/03/2023: Pt arrives w/o pain. Patient progressed with further postural stability activities. Patient tolerates activities without increase in pain. Only requires min cueing to prevent forward lean. Overall demonstrates improved thoracic mobility and functional strength. Patient still reports difficulty towards end of session due to decreased muscular endurance. With fatigue does demonstrate mild, intermittent upper trap hiking. Patient continues to require skilled PT services to address deficits and return to prior level of function.   Per eval - Jonita is a 28 y.o. female who was seen today  for physical therapy evaluation and treatment for Neck and Thoracic Pain related to referring dx of hypertrophy of breast. She is demonstrating decreased UQ MMT, decreased TS mobility, and decreased postural endurance. She has related pain and difficulty with lifting, taking deep breaths, performing strenuous activities. She requires skilled PT services at this time to address relevant deficits and improve overall function.     OBJECTIVE IMPAIRMENTS: decreased activity tolerance, decreased ROM, decreased strength, postural dysfunction, and pain.   ACTIVITY LIMITATIONS: carrying, lifting, bending, and reach over head  PARTICIPATION LIMITATIONS: meal prep, cleaning, laundry, community activity, and occupation  PERSONAL FACTORS: Time since onset of injury/illness/exacerbation are also affecting patient's functional outcome.   REHAB  POTENTIAL: Good  CLINICAL DECISION MAKING: Stable/uncomplicated  EVALUATION COMPLEXITY: Low   GOALS: Goals reviewed with patient? YES  SHORT TERM GOALS: Target date: 11/10/2023   Patient will be independent with initial home program at least 3 days/week.  Baseline: provided at eval Goal Status: INITIAL   2.  Patient will demonstrate improved postural awareness for at least 15 minutes while seated without need for cueing from PT.  Baseline: see objective measures Goal Status: INITIAL    LONG TERM GOALS: Target date: 12/08/2023    Patient will report improved overall functional ability with PSFS average score of 6 or greater.  Baseline: avg 4 Goal Status: INITIAL    2.  Patient will demonstrate improved BIL UQ strength to at least 4+/5 MMT  Baseline: see objective measures Goal status: INITIAL  3.  Patient will demonstrate ability to perform floor to waist lifting of at least 25# using appropriate body mechanics and with no more than minimal pain in order to safely perform normal daily/occupational tasks.   Baseline: limited d/t pain with bending    Goal Status: INITIAL   4.  Patient will demonstrate ability to perform overhead lifting of at least 10# using appropriate body mechanics and with no more than minimal pain in order to safely perform normal daily/occupational tasks.   Baseline: limited d/t pain with overhead reaching  Goal Status: INITIAL   PLAN:  PT FREQUENCY: 1x/week  PT DURATION: 8 weeks  PLANNED INTERVENTIONS: 97164- PT Re-evaluation, 97110-Therapeutic exercises, 97530- Therapeutic activity, 97112- Neuromuscular re-education, 97535- Self Care, [id number]- Manual therapy, [id number]- Electrical stimulation (unattended), Patient/Family education, Taping, Dry Needling, Joint mobilization, Spinal manipulation, Spinal mobilization, Cryotherapy, and Moist heat.  PLAN FOR NEXT SESSION: cervicothoracic mobility, periscapular strengthening, UBE, other interventions for pain modulation as indicated, patient education as indicated    Preesha Benjamin Nicanor J Amelio Brosky PT, DPT  11/03/2023 10:39 AM

## 2023-11-10 ENCOUNTER — Encounter: Payer: Self-pay | Admitting: Physical Therapy

## 2023-11-10 ENCOUNTER — Ambulatory Visit: Admitting: Physical Therapy

## 2023-11-10 DIAGNOSIS — M542 Cervicalgia: Secondary | ICD-10-CM

## 2023-11-10 DIAGNOSIS — M546 Pain in thoracic spine: Secondary | ICD-10-CM

## 2023-11-10 DIAGNOSIS — R293 Abnormal posture: Secondary | ICD-10-CM

## 2023-11-10 NOTE — Therapy (Signed)
 OUTPATIENT PHYSICAL THERAPY TREATMENT   Patient Name: Vickie Morales MRN: [id number] DOB:1996-01-18, 28 y.o., female Today's Date: 11/10/2023  END OF SESSION:  PT End of Session - 11/10/23 0946     Visit Number 5    Number of Visits 8    Date for PT Re-Evaluation 11/10/23   updated to match cert order   Authorization Type BCBS    PT Start Time 0945    PT Stop Time 1026    PT Time Calculation (min) 41 min    Activity Tolerance Patient tolerated treatment well    Behavior During Therapy [facility] for tasks assessed/performed            Past Medical History:  Diagnosis Date   Allergy    Anemia    Asthma    Headache    Past Surgical History:  Procedure Laterality Date   MOUTH SURGERY     TONSILLECTOMY AND ADENOIDECTOMY Bilateral 2011   Patient Active Problem List   Diagnosis Date Noted   ASCUS of cervix with negative high risk HPV 03/01/2023   Oral contraceptive use 02/26/2023   Biological false positive RPR test 01/07/2022   Tension headache, chronic 04/23/2015    PCP: Sun, Vyvyan, MD  REFERRING PROVIDER: Waddell Leonce NOVAK, MD   REFERRING DIAG:  N62 (ICD-10-CM) - Hypertrophy of breast  M54.6 (ICD-10-CM) - Thoracic back pain, unspecified back pain laterality, unspecified chronicity  Eval. and Treat Neck and Back pain due to mammary Hypertrophy. Insurance requires 6 weeks of treatment.    Rationale for Evaluation and Treatment: Rehabilitation  THERAPY DIAG:  Cervicalgia  Pain in thoracic spine  Abnormal posture  ONSET DATE: 09/27/2023 date of referral   SUBJECTIVE:                                                                                                                                                                                          Per eval - Patient reports to PT with history of neck and upper back pain for a couple of years, that she is planning to have breast reduction procedure for this year. She would like to make sure that she is strong  enough for surgery. She has difficulty with strenuous activity. She feels that her bra straps are digging into her shoulder. She is unable to jump d/t pain. She is walking, and going to the gym to work on cardio (treadmill) and core exercises. She has not been incorporating UE or LE strengthening exercises.  SUBJECTIVE STATEMENT: 11/10/2023: Pt reports that she is feeling less tension.  PERTINENT HISTORY:  PMHx of migraines   PAIN:  Are you having  pain?  Yes: NPRS scale: 5/10 current, 8/10 worst  Pain location: Upper back>Neck  Pain description: aching,  Aggravating factors: lifting, reaching, bending, breathing, strenous activity Relieving factors: ibuprofen    PRECAUTIONS: None  RED FLAGS: None   WEIGHT BEARING RESTRICTIONS: No  FALLS:  Has patient fallen in last 6 months? No  LIVING ENVIRONMENT: Lives with: lives with their family Lives in: House/apartment Stairs: Yes   OCCUPATION: Vet Tech  PLOF: Independent  PATIENT GOALS: To be strong enough for surgery   NEXT MD VISIT: 12/25/2023 Mammoplasty Reduction Procedure   OBJECTIVE:  Note: Objective measures were completed at Evaluation unless otherwise noted.  DIAGNOSTIC FINDINGS:  09/11/22  CLINICAL DATA:  Provided history: Shortness of breath.   EXAM: CHEST - 2 VIEW   COMPARISON:  Prior chest radiographs 04/24/2019 and earlier   FINDINGS: Heart size within normal limits. No appreciable airspace consolidation. No evidence of pleural effusion or pneumothorax. No acute osseous abnormality identified.   IMPRESSION: No evidence of active cardiopulmonary disease.      PATIENT SURVEYS:   Patient Specific Functional Scale:  Activity Eval   Bend down (to pick something up) without pain  4   Wear clothes comfortably 3   Breathe comfortably 6   Strenuous Activity 3   Average 4    (Activities rated 0-10/10.  10 represents able to perform at prior level" while 0 represents "unable to perform."  )   COGNITION: Overall cognitive status: Within functional limits for tasks assessed     SENSATION: Not tested   POSTURE: rounded shoulders and forward head  PALPATION: Moderate tenderness to palpation along BIL UT, and periscapular mm    CERVICAL ROM:   Active ROM A/PROM (deg) eval  Flexion WFL  Extension WFL  Right lateral flexion WFL  Left lateral flexion WFL  Right rotation WFL  Left rotation [facility]  Thoracic Flexion 50%  Thoracic Extension 75%   (Blank rows = not tested)    UPPER EXTREMITY MMT:  MMT Right eval Left eval  Shoulder flexion 4- 4-  Shoulder extension    Shoulder abduction 4- 4-  Shoulder adduction    Shoulder extension    Shoulder internal rotation 4+ 4+  Shoulder external rotation 4- 4-  Middle trapezius 4- 4-  Lower trapezius 4- 4-  Elbow flexion 4+ 4+  Elbow extension 4- 4-  Wrist flexion    Wrist extension    Wrist ulnar deviation    Wrist radial deviation    Wrist pronation    Wrist supination    Grip strength     (Blank rows = not tested)     TREATMENT DATE:  [facility] Adult PT Treatment:                                                DATE:  11/10/2023 Therapeutic Exercise: 3 way ball roll outs x3 minutes 2x10 single arm punch ups 10 lbs Foam roller routine for thoracic mobility - including protraction/retraction (unilateral and bilateral), cc/cw circles, horizontal abduction, shoulder flexion/ext alternating, shoulder abduction, thread the needle, and thoracic ext Cat camel - 2x10 2x10 RTB cheerleaders for postural control Barrel hug with RTB - 2x10 3x10 cable column rows 17 lbs 3x30 second bus drivers with RTB  Lateral band walks - RTB Prone Y, T, W on ball   10/27/23 Therapeutic Exercise: Scap retraction x10 Seated  chin tuck x10 LS stretch 2x30sec BIL Red band row 2x10 w/ 3-5 sec iso hold  Seated foam roll thoracic extension x8 arms crossed Seated foam roll thoracic ext hands behind head x8  HEP update +  education  Neuromuscular re-ed: Prone Y x10 Prone T x 10  Double ER + scap retraction red band 2x10 cues for posture Red band at wrist double scaption 2x8 at wall cues for posture/pacing and appropriate tension                                                                                                                                   PATIENT EDUCATION:  Education details: rationale for interventions, HEP  Person educated: Patient Education method: Explanation, Demonstration, Tactile cues, Verbal cues Education comprehension: verbalized understanding, returned demonstration, verbal cues required, tactile cues required, and needs further education     HOME EXERCISE PROGRAM: Access Code: [email or web address] URL: [email or web address] Date: 10/27/2023 Prepared by: Alm Jenny  Exercises - Standing Cervical Retraction  - 1 x daily - 7 x weekly - 2 sets - 10 reps - 3 sec hold hold - Gentle Levator Scapulae Stretch  - 1 x daily - 7 x weekly - 2-3 reps - 20-30 sec hold - Prone Scapular Retraction Y  - 1 x daily - 7 x weekly - 2 sets - 10 reps - Shoulder External Rotation and Scapular Retraction with Resistance  - 1 x daily - 7 x weekly - 2 sets - 8 reps - Standing Shoulder Row with Anchored Resistance  - 1 x daily - 7 x weekly - 2 sets - 8 reps  ASSESSMENT:  CLINICAL IMPRESSION: 11/10/2023: Vickie Morales tolerated session well with no adverse reaction.  Pt with continued improved function and decreased pain with daily activities.  Concentrated on scapulothoracic mobility combined with periscapular endurance and strengthening.  Pt with reports of fatigue and mild tightness towards end of session but no increase in pain reported.    Per eval - Vickie Morales is a 28 y.o. female who was seen today for physical therapy evaluation and treatment for Neck and Thoracic Pain related to referring dx of hypertrophy of breast. She is demonstrating decreased UQ MMT, decreased TS mobility, and decreased  postural endurance. She has related pain and difficulty with lifting, taking deep breaths, performing strenuous activities. She requires skilled PT services at this time to address relevant deficits and improve overall function.     OBJECTIVE IMPAIRMENTS: decreased activity tolerance, decreased ROM, decreased strength, postural dysfunction, and pain.   ACTIVITY LIMITATIONS: carrying, lifting, bending, and reach over head  PARTICIPATION LIMITATIONS: meal prep, cleaning, laundry, community activity, and occupation  PERSONAL FACTORS: Time since onset of injury/illness/exacerbation are also affecting patient's functional outcome.   REHAB POTENTIAL: Good  CLINICAL DECISION MAKING: Stable/uncomplicated  EVALUATION COMPLEXITY: Low   GOALS: Goals reviewed with patient? YES  SHORT TERM GOALS: Target date: 11/10/2023   Patient will be independent with initial  home program at least 3 days/week.  Baseline: provided at eval Goal Status: INITIAL   2.  Patient will demonstrate improved postural awareness for at least 15 minutes while seated without need for cueing from PT.  Baseline: see objective measures Goal Status: INITIAL    LONG TERM GOALS: Target date: 12/08/2023    Patient will report improved overall functional ability with PSFS average score of 6 or greater.  Baseline: avg 4 Goal Status: INITIAL    2.  Patient will demonstrate improved BIL UQ strength to at least 4+/5 MMT  Baseline: see objective measures Goal status: INITIAL  3.  Patient will demonstrate ability to perform floor to waist lifting of at least 25# using appropriate body mechanics and with no more than minimal pain in order to safely perform normal daily/occupational tasks.   Baseline: limited d/t pain with bending   Goal Status: INITIAL   4.  Patient will demonstrate ability to perform overhead lifting of at least 10# using appropriate body mechanics and with no more than minimal pain in order to safely  perform normal daily/occupational tasks.   Baseline: limited d/t pain with overhead reaching  Goal Status: INITIAL   PLAN:  PT FREQUENCY: 1x/week  PT DURATION: 8 weeks  PLANNED INTERVENTIONS: 97164- PT Re-evaluation, 97110-Therapeutic exercises, 97530- Therapeutic activity, 97112- Neuromuscular re-education, 97535- Self Care, 02859- Manual therapy, G0283- Electrical stimulation (unattended), Patient/Family education, Taping, Dry Needling, Joint mobilization, Spinal manipulation, Spinal mobilization, Cryotherapy, and Moist heat.  PLAN FOR NEXT SESSION: cervicothoracic mobility, periscapular strengthening, UBE, other interventions for pain modulation as indicated, patient education as indicated    Helene FORBES Gasmen PT, DPT  11/10/2023 10:31 AM

## 2023-11-13 DIAGNOSIS — M654 Radial styloid tenosynovitis [de Quervain]: Secondary | ICD-10-CM | POA: Diagnosis not present

## 2023-11-24 ENCOUNTER — Ambulatory Visit: Attending: Plastic Surgery

## 2023-11-24 DIAGNOSIS — M546 Pain in thoracic spine: Secondary | ICD-10-CM | POA: Insufficient documentation

## 2023-11-24 DIAGNOSIS — M542 Cervicalgia: Secondary | ICD-10-CM | POA: Diagnosis not present

## 2023-11-24 DIAGNOSIS — R293 Abnormal posture: Secondary | ICD-10-CM | POA: Insufficient documentation

## 2023-11-24 NOTE — Therapy (Signed)
 OUTPATIENT PHYSICAL THERAPY TREATMENT   Patient Name: LEALA BRYAND MRN: [id number] DOB:[birth year], 28 y.o., female Today's Date: 11/24/2023  END OF SESSION:  PT End of Session - 11/24/23 0819     Visit Number 6    Number of Visits 8    Date for PT Re-Evaluation 11/10/23    Authorization Type BCBS    PT Start Time 0815    PT Stop Time 0853    PT Time Calculation (min) 38 min    Activity Tolerance Patient tolerated treatment well    Behavior During Therapy [facility] for tasks assessed/performed             Past Medical History:  Diagnosis Date   Allergy    Anemia    Asthma    Headache    Past Surgical History:  Procedure Laterality Date   MOUTH SURGERY     TONSILLECTOMY AND ADENOIDECTOMY Bilateral 2011   Patient Active Problem List   Diagnosis Date Noted   ASCUS of cervix with negative high risk HPV 03/01/2023   Oral contraceptive use 02/26/2023   Biological false positive RPR test 01/07/2022   Tension headache, chronic 04/23/2015    PCP: Sun, Vyvyan, MD  REFERRING PROVIDER: Waddell Leonce NOVAK, MD   REFERRING DIAG:  N62 (ICD-10-CM) - Hypertrophy of breast  M54.6 (ICD-10-CM) - Thoracic back pain, unspecified back pain laterality, unspecified chronicity  Eval. and Treat Neck and Back pain due to mammary Hypertrophy. Insurance requires 6 weeks of treatment.    Rationale for Evaluation and Treatment: Rehabilitation  THERAPY DIAG:  Cervicalgia  Pain in thoracic spine  Abnormal posture  ONSET DATE: 09/27/2023 date of referral   SUBJECTIVE:                                                                                                                                                                                          Per eval - Patient reports to PT with history of neck and upper back pain for a couple of years, that she is planning to have breast reduction procedure for this year. She would like to make sure that she is strong enough for surgery. She has  difficulty with strenuous activity. She feels that her bra straps are digging into her shoulder. She is unable to jump d/t pain. She is walking, and going to the gym to work on cardio (treadmill) and core exercises. She has not been incorporating UE or LE strengthening exercises.  SUBJECTIVE STATEMENT: 11/24/2023: Pt reports that she is feeling better on Saturdays   PERTINENT HISTORY:  PMHx of migraines   PAIN:  Are you having pain?  Yes:  NPRS scale: 5/10 current, 8/10 worst  Pain location: Upper back>Neck  Pain description: aching,  Aggravating factors: lifting, reaching, bending, breathing, strenous activity Relieving factors: ibuprofen    PRECAUTIONS: None  RED FLAGS: None   WEIGHT BEARING RESTRICTIONS: No  FALLS:  Has patient fallen in last 6 months? No  LIVING ENVIRONMENT: Lives with: lives with their family Lives in: House/apartment Stairs: Yes   OCCUPATION: Vet Tech  PLOF: Independent  PATIENT GOALS: To be strong enough for surgery   NEXT MD VISIT: 12/25/2023 Mammoplasty Reduction Procedure   OBJECTIVE:  Note: Objective measures were completed at Evaluation unless otherwise noted.  DIAGNOSTIC FINDINGS:  09/11/22  CLINICAL DATA:  Provided history: Shortness of breath.   EXAM: CHEST - 2 VIEW   COMPARISON:  Prior chest radiographs 04/24/2019 and earlier   FINDINGS: Heart size within normal limits. No appreciable airspace consolidation. No evidence of pleural effusion or pneumothorax. No acute osseous abnormality identified.   IMPRESSION: No evidence of active cardiopulmonary disease.      PATIENT SURVEYS:   Patient Specific Functional Scale:  Activity Eval   Bend down (to pick something up) without pain  4   Wear clothes comfortably 3   Breathe comfortably 6   Strenuous Activity 3   Average 4    (Activities rated 0-10/10.  10 represents able to perform at prior level" while 0 represents "unable to perform." )   COGNITION: Overall  cognitive status: Within functional limits for tasks assessed     SENSATION: Not tested   POSTURE: rounded shoulders and forward head  PALPATION: Moderate tenderness to palpation along BIL UT, and periscapular mm    CERVICAL ROM:   Active ROM A/PROM (deg) eval  Flexion WFL  Extension WFL  Right lateral flexion WFL  Left lateral flexion WFL  Right rotation WFL  Left rotation [facility]  Thoracic Flexion 50%  Thoracic Extension 75%   (Blank rows = not tested)    UPPER EXTREMITY MMT:  MMT Right eval Left eval  Shoulder flexion 4- 4-  Shoulder extension    Shoulder abduction 4- 4-  Shoulder adduction    Shoulder extension    Shoulder internal rotation 4+ 4+  Shoulder external rotation 4- 4-  Middle trapezius 4- 4-  Lower trapezius 4- 4-  Elbow flexion 4+ 4+  Elbow extension 4- 4-  Wrist flexion    Wrist extension    Wrist ulnar deviation    Wrist radial deviation    Wrist pronation    Wrist supination    Grip strength     (Blank rows = not tested)     TREATMENT DATE:   [facility] Adult PT Treatment:                                                DATE:  11/24/2023  Therapeutic Exercise: 3 way ball roll outs x3 minutes Foam roller routine for thoracic mobility x10-15 each as tolerated- including protraction/retraction (unilateral and bilateral), cc/cw circles, horizontal abduction, shoulder flexion/ext alternating, shoulder abduction,  Foam roller routine x 15 - thread the needle, and thoracic ext Cat camel - 2x10 x 15 RTB cheerleaders for postural control Barrel hug with green TB - 2x10 3x30 second bus drivers with RTB  [id number] cable column rows 13 lbs Lateral band wall walks - RTB 2 laps  Prone Y, T, W on ball x  15 each   [facility] Adult PT Treatment:                                                DATE:  11/10/2023 Therapeutic Exercise: 3 way ball roll outs x3 minutes 2x10 single arm punch ups 10 lbs Foam roller routine for thoracic mobility - including  protraction/retraction (unilateral and bilateral), cc/cw circles, horizontal abduction, shoulder flexion/ext alternating, shoulder abduction, thread the needle, and thoracic ext Cat camel - 2x10 2x10 RTB cheerleaders for postural control Barrel hug with RTB - 2x10 3x10 cable column rows 17 lbs 3x30 second bus drivers with RTB  Lateral band walks - RTB Prone Y, T, W on ball   10/27/23 Therapeutic Exercise: Scap retraction x10 Seated chin tuck x10 LS stretch 2x30sec BIL Red band row 2x10 w/ 3-5 sec iso hold  Seated foam roll thoracic extension x8 arms crossed Seated foam roll thoracic ext hands behind head x8  HEP update + education  Neuromuscular re-ed: Prone Y x10 Prone T x 10  Double ER + scap retraction red band 2x10 cues for posture Red band at wrist double scaption 2x8 at wall cues for posture/pacing and appropriate tension                                                                                                                                   PATIENT EDUCATION:  Education details: rationale for interventions, HEP  Person educated: Patient Education method: Explanation, Demonstration, Tactile cues, Verbal cues Education comprehension: verbalized understanding, returned demonstration, verbal cues required, tactile cues required, and needs further education     HOME EXERCISE PROGRAM: Access Code: [email or web address] URL: [email or web address] Date: 10/27/2023 Prepared by: Alm Jenny  Exercises - Standing Cervical Retraction  - 1 x daily - 7 x weekly - 2 sets - 10 reps - 3 sec hold hold - Gentle Levator Scapulae Stretch  - 1 x daily - 7 x weekly - 2-3 reps - 20-30 sec hold - Prone Scapular Retraction Y  - 1 x daily - 7 x weekly - 2 sets - 10 reps - Shoulder External Rotation and Scapular Retraction with Resistance  - 1 x daily - 7 x weekly - 2 sets - 8 reps - Standing Shoulder Row with Anchored Resistance  - 1 x daily - 7 x weekly - 2 sets - 8  reps  ASSESSMENT:  CLINICAL IMPRESSION: 11/24/2023: Sueellen had good tolerance of today's treatment session, which focused on progression of periscapular strengthening and scapulothoracic mobiltiy. She was able to tolerate some increased volume and resistance with a few exercises today. We will continue to progress per POC as tolerated, in order to reach established rehab goals.    Per eval - Willamae is a 28 y.o. female who was seen today for physical therapy  evaluation and treatment for Neck and Thoracic Pain related to referring dx of hypertrophy of breast. She is demonstrating decreased UQ MMT, decreased TS mobility, and decreased postural endurance. She has related pain and difficulty with lifting, taking deep breaths, performing strenuous activities. She requires skilled PT services at this time to address relevant deficits and improve overall function.     OBJECTIVE IMPAIRMENTS: decreased activity tolerance, decreased ROM, decreased strength, postural dysfunction, and pain.   ACTIVITY LIMITATIONS: carrying, lifting, bending, and reach over head  PARTICIPATION LIMITATIONS: meal prep, cleaning, laundry, community activity, and occupation  PERSONAL FACTORS: Time since onset of injury/illness/exacerbation are also affecting patient's functional outcome.   REHAB POTENTIAL: Good  CLINICAL DECISION MAKING: Stable/uncomplicated  EVALUATION COMPLEXITY: Low   GOALS: Goals reviewed with patient? YES  SHORT TERM GOALS: Target date: 11/10/2023   Patient will be independent with initial home program at least 3 days/week.  Baseline: provided at eval Goal Status: INITIAL   2.  Patient will demonstrate improved postural awareness for at least 15 minutes while seated without need for cueing from PT.  Baseline: see objective measures Goal Status: INITIAL    LONG TERM GOALS: Target date: 12/08/2023    Patient will report improved overall functional ability with PSFS average score of 6 or  greater.  Baseline: avg 4 Goal Status: INITIAL    2.  Patient will demonstrate improved BIL UQ strength to at least 4+/5 MMT  Baseline: see objective measures Goal status: INITIAL  3.  Patient will demonstrate ability to perform floor to waist lifting of at least 25# using appropriate body mechanics and with no more than minimal pain in order to safely perform normal daily/occupational tasks.   Baseline: limited d/t pain with bending   Goal Status: INITIAL   4.  Patient will demonstrate ability to perform overhead lifting of at least 10# using appropriate body mechanics and with no more than minimal pain in order to safely perform normal daily/occupational tasks.   Baseline: limited d/t pain with overhead reaching  Goal Status: INITIAL   PLAN:  PT FREQUENCY: 1x/week  PT DURATION: 8 weeks  PLANNED INTERVENTIONS: 97164- PT Re-evaluation, 97110-Therapeutic exercises, 97530- Therapeutic activity, 97112- Neuromuscular re-education, 97535- Self Care, [id number]- Manual therapy, G0283- Electrical stimulation (unattended), Patient/Family education, Taping, Dry Needling, Joint mobilization, Spinal manipulation, Spinal mobilization, Cryotherapy, and Moist heat.  PLAN FOR NEXT SESSION: cervicothoracic mobility, periscapular strengthening, UBE, other interventions for pain modulation as indicated, patient education as indicated    Marko Molt, PT, DPT  11/24/2023 8:59 AM

## 2023-12-01 ENCOUNTER — Ambulatory Visit: Admitting: Physical Therapy

## 2023-12-01 ENCOUNTER — Encounter: Payer: Self-pay | Admitting: Physical Therapy

## 2023-12-01 DIAGNOSIS — R293 Abnormal posture: Secondary | ICD-10-CM | POA: Diagnosis not present

## 2023-12-01 DIAGNOSIS — M542 Cervicalgia: Secondary | ICD-10-CM | POA: Diagnosis not present

## 2023-12-01 DIAGNOSIS — M546 Pain in thoracic spine: Secondary | ICD-10-CM | POA: Diagnosis not present

## 2023-12-01 NOTE — Therapy (Signed)
 OUTPATIENT PHYSICAL THERAPY TREATMENT   Patient Name: Vickie Morales MRN: [id number] DOB:1995-08-16, 28 y.o., female Today's Date: 12/01/2023  END OF SESSION:  PT End of Session - 12/01/23 0818     Visit Number 7    Number of Visits 8    Date for PT Re-Evaluation 12/08/23    Authorization Type BCBS    PT Start Time 0818    PT Stop Time 0900    PT Time Calculation (min) 42 min    Activity Tolerance Patient tolerated treatment well    Behavior During Therapy [facility] for tasks assessed/performed             Past Medical History:  Diagnosis Date   Allergy    Anemia    Asthma    Headache    Past Surgical History:  Procedure Laterality Date   MOUTH SURGERY     TONSILLECTOMY AND ADENOIDECTOMY Bilateral 2011   Patient Active Problem List   Diagnosis Date Noted   ASCUS of cervix with negative high risk HPV 03/01/2023   Oral contraceptive use 02/26/2023   Biological false positive RPR test 01/07/2022   Tension headache, chronic 04/23/2015    PCP: Sun, Vyvyan, MD  REFERRING PROVIDER: Waddell Leonce NOVAK, MD   REFERRING DIAG:  N62 (ICD-10-CM) - Hypertrophy of breast  M54.6 (ICD-10-CM) - Thoracic back pain, unspecified back pain laterality, unspecified chronicity  Eval. and Treat Neck and Back pain due to mammary Hypertrophy. Insurance requires 6 weeks of treatment.    Rationale for Evaluation and Treatment: Rehabilitation  THERAPY DIAG:  Cervicalgia - Plan: PT plan of care cert/re-cert  Pain in thoracic spine - Plan: PT plan of care cert/re-cert  Abnormal posture - Plan: PT plan of care cert/re-cert  ONSET DATE: 09/27/2023 date of referral   SUBJECTIVE:                                                                                                                                                                                          Per eval - Patient reports to PT with history of neck and upper back pain for a couple of years, that she is planning to have breast  reduction procedure for this year. She would like to make sure that she is strong enough for surgery. She has difficulty with strenuous activity. She feels that her bra straps are digging into her shoulder. She is unable to jump d/t pain. She is walking, and going to the gym to work on cardio (treadmill) and core exercises. She has not been incorporating UE or LE strengthening exercises.  SUBJECTIVE STATEMENT: 12/01/2023: Pt reports that she is having  minimal pain today  PERTINENT HISTORY:  PMHx of migraines   PAIN:  Are you having pain?  Yes: NPRS scale: 3/10 current, 8/10 worst  Pain location: Upper back>Neck  Pain description: aching,  Aggravating factors: lifting, reaching, bending, breathing, strenous activity Relieving factors: ibuprofen    PRECAUTIONS: None  RED FLAGS: None   WEIGHT BEARING RESTRICTIONS: No  FALLS:  Has patient fallen in last 6 months? No  LIVING ENVIRONMENT: Lives with: lives with their family Lives in: House/apartment Stairs: Yes   OCCUPATION: Vet Tech  PLOF: Independent  PATIENT GOALS: To be strong enough for surgery   NEXT MD VISIT: 12/25/2023 Mammoplasty Reduction Procedure   OBJECTIVE:  Note: Objective measures were completed at Evaluation unless otherwise noted.  DIAGNOSTIC FINDINGS:  09/11/22  CLINICAL DATA:  Provided history: Shortness of breath.   EXAM: CHEST - 2 VIEW   COMPARISON:  Prior chest radiographs 04/24/2019 and earlier   FINDINGS: Heart size within normal limits. No appreciable airspace consolidation. No evidence of pleural effusion or pneumothorax. No acute osseous abnormality identified.   IMPRESSION: No evidence of active cardiopulmonary disease.      PATIENT SURVEYS:   Patient Specific Functional Scale:  Activity Eval   Bend down (to pick something up) without pain  4   Wear clothes comfortably 3   Breathe comfortably 6   Strenuous Activity 3   Average 4    (Activities rated 0-10/10.  10 represents  able to perform at prior level" while 0 represents "unable to perform." )   COGNITION: Overall cognitive status: Within functional limits for tasks assessed     SENSATION: Not tested   POSTURE: rounded shoulders and forward head  PALPATION: Moderate tenderness to palpation along BIL UT, and periscapular mm    CERVICAL ROM:   Active ROM A/PROM (deg) eval  Flexion WFL  Extension WFL  Right lateral flexion WFL  Left lateral flexion WFL  Right rotation WFL  Left rotation [facility]  Thoracic Flexion 50%  Thoracic Extension 75%   (Blank rows = not tested)    UPPER EXTREMITY MMT:  MMT Right eval Left eval  Shoulder flexion 4- 4-  Shoulder extension    Shoulder abduction 4- 4-  Shoulder adduction    Shoulder extension    Shoulder internal rotation 4+ 4+  Shoulder external rotation 4- 4-  Middle trapezius 4- 4-  Lower trapezius 4- 4-  Elbow flexion 4+ 4+  Elbow extension 4- 4-  Wrist flexion    Wrist extension    Wrist ulnar deviation    Wrist radial deviation    Wrist pronation    Wrist supination    Grip strength     (Blank rows = not tested)     TREATMENT DATE:   [facility] Adult PT Treatment:                                                DATE:  12/01/2023  Therapeutic Exercise: 3 way ball roll outs x3 minutes Foam roller routine for thoracic mobility x10-15 each as tolerated- including protraction/retraction (unilateral and bilateral), cc/cw circles, horizontal abduction, shoulder flexion/ext alternating, shoulder abduction,  Foam roller routine x 15 - thread the needle, and thoracic ext Cat camel - 2x10 3x30 second bus drivers with RTB  Lateral band walks - RTB - 2 laps Band walks - vertical - 2 laps 3x10  cable column rows 13 lbs  Prone Y, T, W on ball x 15 each   Therapeutic Activity  Standing land mine press - 2x10  [facility] Adult PT Treatment:                                                DATE:  11/10/2023 Therapeutic Exercise: 3 way ball roll outs  x3 minutes 2x10 single arm punch ups 10 lbs Foam roller routine for thoracic mobility - including protraction/retraction (unilateral and bilateral), cc/cw circles, horizontal abduction, shoulder flexion/ext alternating, shoulder abduction, thread the needle, and thoracic ext Cat camel - 2x10 2x10 RTB cheerleaders for postural control Barrel hug with RTB - 2x10 3x10 cable column rows 17 lbs 3x30 second bus drivers with RTB  Lateral band walks - RTB Prone Y, T, W on ball   10/27/23 Therapeutic Exercise: Scap retraction x10 Seated chin tuck x10 LS stretch 2x30sec BIL Red band row 2x10 w/ 3-5 sec iso hold  Seated foam roll thoracic extension x8 arms crossed Seated foam roll thoracic ext hands behind head x8  HEP update + education  Neuromuscular re-ed: Prone Y x10 Prone T x 10  Double ER + scap retraction red band 2x10 cues for posture Red band at wrist double scaption 2x8 at wall cues for posture/pacing and appropriate tension                                                                                                                                   PATIENT EDUCATION:  Education details: rationale for interventions, HEP  Person educated: Patient Education method: Explanation, Demonstration, Tactile cues, Verbal cues Education comprehension: verbalized understanding, returned demonstration, verbal cues required, tactile cues required, and needs further education     HOME EXERCISE PROGRAM: Access Code: [email or web address] URL: [email or web address] Date: 10/27/2023 Prepared by: Alm Jenny  Exercises - Standing Cervical Retraction  - 1 x daily - 7 x weekly - 2 sets - 10 reps - 3 sec hold hold - Gentle Levator Scapulae Stretch  - 1 x daily - 7 x weekly - 2-3 reps - 20-30 sec hold - Prone Scapular Retraction Y  - 1 x daily - 7 x weekly - 2 sets - 10 reps - Shoulder External Rotation and Scapular Retraction with Resistance  - 1 x daily - 7 x weekly - 2 sets - 8  reps - Standing Shoulder Row with Anchored Resistance  - 1 x daily - 7 x weekly - 2 sets - 8 reps  ASSESSMENT:  CLINICAL IMPRESSION: 12/01/2023: Sueellen had good tolerance of today's treatment session, which focused on progression of periscapular strengthening and scapulothoracic mobiltiy. Able to add in land mind press and vertical band walks for shoulder girdle and periscapular strengthening with OH movements with fatigue but no increase in pain.  Per eval - Arian is a 28 y.o. female who was seen today for physical therapy evaluation and treatment for Neck and Thoracic Pain related to referring dx of hypertrophy of breast. She is demonstrating decreased UQ MMT, decreased TS mobility, and decreased postural endurance. She has related pain and difficulty with lifting, taking deep breaths, performing strenuous activities. She requires skilled PT services at this time to address relevant deficits and improve overall function.     OBJECTIVE IMPAIRMENTS: decreased activity tolerance, decreased ROM, decreased strength, postural dysfunction, and pain.   ACTIVITY LIMITATIONS: carrying, lifting, bending, and reach over head  PARTICIPATION LIMITATIONS: meal prep, cleaning, laundry, community activity, and occupation  PERSONAL FACTORS: Time since onset of injury/illness/exacerbation are also affecting patient's functional outcome.   REHAB POTENTIAL: Good  CLINICAL DECISION MAKING: Stable/uncomplicated  EVALUATION COMPLEXITY: Low   GOALS: Goals reviewed with patient? YES  SHORT TERM GOALS: Target date: 11/10/2023   Patient will be independent with initial home program at least 3 days/week.  Baseline: provided at eval Goal Status: INITIAL   2.  Patient will demonstrate improved postural awareness for at least 15 minutes while seated without need for cueing from PT.  Baseline: see objective measures Goal Status: INITIAL    LONG TERM GOALS: Target date: 12/08/2023    Patient will  report improved overall functional ability with PSFS average score of 6 or greater.  Baseline: avg 4 Goal Status: INITIAL    2.  Patient will demonstrate improved BIL UQ strength to at least 4+/5 MMT  Baseline: see objective measures Goal status: INITIAL  3.  Patient will demonstrate ability to perform floor to waist lifting of at least 25# using appropriate body mechanics and with no more than minimal pain in order to safely perform normal daily/occupational tasks.   Baseline: limited d/t pain with bending   Goal Status: INITIAL   4.  Patient will demonstrate ability to perform overhead lifting of at least 10# using appropriate body mechanics and with no more than minimal pain in order to safely perform normal daily/occupational tasks.   Baseline: limited d/t pain with overhead reaching  Goal Status: INITIAL   PLAN:  PT FREQUENCY: 1x/week  PT DURATION: 8 weeks  PLANNED INTERVENTIONS: 97164- PT Re-evaluation, 97110-Therapeutic exercises, 97530- Therapeutic activity, 97112- Neuromuscular re-education, 97535- Self Care, 02859- Manual therapy, G0283- Electrical stimulation (unattended), Patient/Family education, Taping, Dry Needling, Joint mobilization, Spinal manipulation, Spinal mobilization, Cryotherapy, and Moist heat.  PLAN FOR NEXT SESSION: cervicothoracic mobility, periscapular strengthening, UBE, other interventions for pain modulation as indicated, patient education as indicated    Helene FORBES Gasmen PT 12/01/2023 9:15 AM

## 2023-12-08 ENCOUNTER — Encounter: Payer: Self-pay | Admitting: Physical Therapy

## 2023-12-08 ENCOUNTER — Ambulatory Visit: Admitting: Physical Therapy

## 2023-12-08 DIAGNOSIS — M542 Cervicalgia: Secondary | ICD-10-CM | POA: Diagnosis not present

## 2023-12-08 DIAGNOSIS — R293 Abnormal posture: Secondary | ICD-10-CM

## 2023-12-08 DIAGNOSIS — M546 Pain in thoracic spine: Secondary | ICD-10-CM | POA: Diagnosis not present

## 2023-12-08 NOTE — Therapy (Signed)
 PHYSICAL THERAPY DISCHARGE SUMMARY  Visits from Start of Care: 8  Current functional level related to goals / functional outcomes: See assessment/goals   Remaining deficits: See assessment/goals   Education / Equipment: HEP and D/C plans  Patient agrees to discharge. Patient goals were met. Patient is being discharged due to meeting the stated rehab goals.   Patient Name: Vickie Morales MRN: [id number] DOB:1996/01/14, 28 y.o., female Today's Date: 12/08/2023  END OF SESSION:  PT End of Session - 12/08/23 0815     Visit Number 8    Number of Visits 8    Date for PT Re-Evaluation 12/08/23    Authorization Type BCBS    PT Start Time 0815    PT Stop Time 0845    PT Time Calculation (min) 30 min    Activity Tolerance Patient tolerated treatment well    Behavior During Therapy [facility] for tasks assessed/performed             Past Medical History:  Diagnosis Date   Allergy    Anemia    Asthma    Headache    Past Surgical History:  Procedure Laterality Date   MOUTH SURGERY     TONSILLECTOMY AND ADENOIDECTOMY Bilateral 2011   Patient Active Problem List   Diagnosis Date Noted   ASCUS of cervix with negative high risk HPV 03/01/2023   Oral contraceptive use 02/26/2023   Biological false positive RPR test 01/07/2022   Tension headache, chronic 04/23/2015    PCP: Sun, Vyvyan, MD  REFERRING PROVIDER: Waddell Leonce NOVAK, MD   REFERRING DIAG:  N62 (ICD-10-CM) - Hypertrophy of breast  M54.6 (ICD-10-CM) - Thoracic back pain, unspecified back pain laterality, unspecified chronicity  Eval. and Treat Neck and Back pain due to mammary Hypertrophy. Insurance requires 6 weeks of treatment.    Rationale for Evaluation and Treatment: Rehabilitation  THERAPY DIAG:  Cervicalgia  Pain in thoracic spine  Abnormal posture  ONSET DATE: 09/27/2023 date of referral   SUBJECTIVE:                                                                                                                                                                                           Per eval - Patient reports to PT with history of neck and upper back pain for a couple of years, that she is planning to have breast reduction procedure for this year. She would like to make sure that she is strong enough for surgery. She has difficulty with strenuous activity. She feels that her bra straps are digging into her shoulder. She is unable to jump d/t pain. She is walking,  and going to the gym to work on cardio (treadmill) and core exercises. She has not been incorporating UE or LE strengthening exercises.  SUBJECTIVE STATEMENT: 12/08/2023: Pt reports that she is having minimal pain today.  She feels she is ready for D/C  PERTINENT HISTORY:  PMHx of migraines   PAIN:  Are you having pain?  Yes: NPRS scale: 3/10 current, 4/10 worst  Pain location: Upper back>Neck  Pain description: aching,  Aggravating factors: lifting, reaching, bending, breathing, strenous activity Relieving factors: ibuprofen    PRECAUTIONS: None  RED FLAGS: None   WEIGHT BEARING RESTRICTIONS: No  FALLS:  Has patient fallen in last 6 months? No  LIVING ENVIRONMENT: Lives with: lives with their family Lives in: House/apartment Stairs: Yes   OCCUPATION: Vet Tech  PLOF: Independent  PATIENT GOALS: To be strong enough for surgery   NEXT MD VISIT: 12/25/2023 Mammoplasty Reduction Procedure   OBJECTIVE:  Note: Objective measures were completed at Evaluation unless otherwise noted.  DIAGNOSTIC FINDINGS:  09/11/22  CLINICAL DATA:  Provided history: Shortness of breath.   EXAM: CHEST - 2 VIEW   COMPARISON:  Prior chest radiographs 04/24/2019 and earlier   FINDINGS: Heart size within normal limits. No appreciable airspace consolidation. No evidence of pleural effusion or pneumothorax. No acute osseous abnormality identified.   IMPRESSION: No evidence of active cardiopulmonary disease.      PATIENT  SURVEYS:   Patient Specific Functional Scale:  Activity Eval 7/26  Bend down (to pick something up) without pain  4 9  Wear clothes comfortably 3 8  Breathe comfortably 6 8  Strenuous Activity 3 10  Average 4 8.75   (Activities rated 0-10/10.  10 represents able to perform at prior level" while 0 represents "unable to perform." )   COGNITION: Overall cognitive status: Within functional limits for tasks assessed     SENSATION: Not tested   POSTURE: rounded shoulders and forward head  PALPATION: Moderate tenderness to palpation along BIL UT, and periscapular mm    CERVICAL ROM:   Active ROM A/PROM (deg) eval  Flexion WFL  Extension WFL  Right lateral flexion WFL  Left lateral flexion WFL  Right rotation [facility]  Left rotation [facility]  Thoracic Flexion 50%  Thoracic Extension 75%   (Blank rows = not tested)    UPPER EXTREMITY MMT:  MMT Right eval Left eval R/L 7/26  Shoulder flexion 4- 4- 4+/4+  Shoulder extension     Shoulder abduction 4- 4- 4+/4+  Shoulder adduction     Shoulder extension     Shoulder internal rotation 4+ 4+   Shoulder external rotation 4- 4- 4+/4+  Middle trapezius 4- 4-   Lower trapezius 4- 4-   Elbow flexion 4+ 4+ 4+/4+  Elbow extension 4- 4- 4+/4+  Wrist flexion     Wrist extension     Wrist ulnar deviation     Wrist radial deviation     Wrist pronation     Wrist supination     Grip strength      (Blank rows = not tested)     TREATMENT DATE:   [facility] Adult PT Treatment:                                                DATE:  12/08/2023  Therapeutic Exercise: 3 way ball roll outs x3 minutes Bus drivers with  RTB  Lateral band walks - RTB - 2 laps Band walks - vertical - 2 laps Prone Y, T, W on ball x 10 each   Therapeutic Activity  collecting information for goals, checking progress, and reviewing with patient Updating HEP  [facility] Adult PT Treatment:                                                DATE:   11/10/2023 Therapeutic Exercise: 3 way ball roll outs x3 minutes 2x10 single arm punch ups 10 lbs Foam roller routine for thoracic mobility - including protraction/retraction (unilateral and bilateral), cc/cw circles, horizontal abduction, shoulder flexion/ext alternating, shoulder abduction, thread the needle, and thoracic ext Cat camel - 2x10 2x10 RTB cheerleaders for postural control Barrel hug with RTB - 2x10 3x10 cable column rows 17 lbs 3x30 second bus drivers with RTB  Lateral band walks - RTB Prone Y, T, W on ball   10/27/23 Therapeutic Exercise: Scap retraction x10 Seated chin tuck x10 LS stretch 2x30sec BIL Red band row 2x10 w/ 3-5 sec iso hold  Seated foam roll thoracic extension x8 arms crossed Seated foam roll thoracic ext hands behind head x8  HEP update + education  Neuromuscular re-ed: Prone Y x10 Prone T x 10  Double ER + scap retraction red band 2x10 cues for posture Red band at wrist double scaption 2x8 at wall cues for posture/pacing and appropriate tension                                                                                                                                   PATIENT EDUCATION:  Education details: rationale for interventions, HEP  Person educated: Patient Education method: Explanation, Demonstration, Tactile cues, Verbal cues Education comprehension: verbalized understanding, returned demonstration, verbal cues required, tactile cues required, and needs further education     HOME EXERCISE PROGRAM: Access Code: [email or web address] URL: [email or web address] Date: 10/27/2023 Prepared by: Alm Jenny  Exercises - Standing Cervical Retraction  - 1 x daily - 7 x weekly - 2 sets - 10 reps - 3 sec hold hold - Gentle Levator Scapulae Stretch  - 1 x daily - 7 x weekly - 2-3 reps - 20-30 sec hold - Prone Scapular Retraction Y  - 1 x daily - 7 x weekly - 2 sets - 10 reps - Shoulder External Rotation and Scapular Retraction with  Resistance  - 1 x daily - 7 x weekly - 2 sets - 8 reps - Standing Shoulder Row with Anchored Resistance  - 1 x daily - 7 x weekly - 2 sets - 8 reps  ASSESSMENT:  CLINICAL IMPRESSION: 12/08/2023: Pt has met all PT goals and is prepared for D/C; pt in agreement with plan.  Per eval - Rael is a 28 y.o. female who was  seen today for physical therapy evaluation and treatment for Neck and Thoracic Pain related to referring dx of hypertrophy of breast. She is demonstrating decreased UQ MMT, decreased TS mobility, and decreased postural endurance. She has related pain and difficulty with lifting, taking deep breaths, performing strenuous activities. She requires skilled PT services at this time to address relevant deficits and improve overall function.     OBJECTIVE IMPAIRMENTS: decreased activity tolerance, decreased ROM, decreased strength, postural dysfunction, and pain.   ACTIVITY LIMITATIONS: carrying, lifting, bending, and reach over head  PARTICIPATION LIMITATIONS: meal prep, cleaning, laundry, community activity, and occupation  PERSONAL FACTORS: Time since onset of injury/illness/exacerbation are also affecting patient's functional outcome.   REHAB POTENTIAL: Good  CLINICAL DECISION MAKING: Stable/uncomplicated  EVALUATION COMPLEXITY: Low   GOALS: Goals reviewed with patient? YES  SHORT TERM GOALS: Target date: 11/10/2023   Patient will be independent with initial home program at least 3 days/week.  Baseline: provided at eval Goal Status: MET   2.  Patient will demonstrate improved postural awareness for at least 15 minutes while seated without need for cueing from PT.  Baseline: see objective measures Goal Status: MET    LONG TERM GOALS: Target date: 12/08/2023    Patient will report improved overall functional ability with PSFS average score of 6 or greater.  Baseline: avg 4 Goal Status: MET    2.  Patient will demonstrate improved BIL UQ strength to at least 4+/5  MMT  Baseline: see objective measures Goal status: MET  3.  Patient will demonstrate ability to perform floor to waist lifting of at least 25# using appropriate body mechanics and with no more than minimal pain in order to safely perform normal daily/occupational tasks.   Baseline: limited d/t pain with bending  7/26: MET  Goal Status: MET   4.  Patient will demonstrate ability to perform overhead lifting of at least 10# using appropriate body mechanics and with no more than minimal pain in order to safely perform normal daily/occupational tasks.   Baseline: limited d/t pain with overhead reaching 7/26: MET  Goal Status: MET   PLAN:  PT FREQUENCY: 1x/week  PT DURATION: 8 weeks  PLANNED INTERVENTIONS: 97164- PT Re-evaluation, 97110-Therapeutic exercises, 97530- Therapeutic activity, 97112- Neuromuscular re-education, 97535- Self Care, 02859- Manual therapy, [id number]- Electrical stimulation (unattended), Patient/Family education, Taping, Dry Needling, Joint mobilization, Spinal manipulation, Spinal mobilization, Cryotherapy, and Moist heat.  PLAN FOR NEXT SESSION: cervicothoracic mobility, periscapular strengthening, UBE, other interventions for pain modulation as indicated, patient education as indicated    Helene FORBES Gasmen PT 12/08/2023 8:50 AM

## 2023-12-10 ENCOUNTER — Ambulatory Visit (INDEPENDENT_AMBULATORY_CARE_PROVIDER_SITE_OTHER): Admitting: Student

## 2023-12-10 ENCOUNTER — Encounter: Payer: Self-pay | Admitting: Student

## 2023-12-10 VITALS — BP 105/71 | HR 89 | Resp 97 | Wt 200.0 lb

## 2023-12-10 DIAGNOSIS — N62 Hypertrophy of breast: Secondary | ICD-10-CM

## 2023-12-10 MED ORDER — OXYCODONE HCL 5 MG PO TABS: 5.0000 mg | ORAL_TABLET | Freq: Four times a day (QID) | ORAL | 0 refills | Status: AC | PRN

## 2023-12-10 MED ORDER — ONDANSETRON HCL 4 MG PO TABS: 4.0000 mg | ORAL_TABLET | Freq: Three times a day (TID) | ORAL | 0 refills | Status: AC | PRN

## 2023-12-10 NOTE — H&P (View-Only) (Signed)
 Patient ID: Vickie Morales, female    DOB: 24-Apr-1996, 28 y.o.   MRN: [id number]  Chief Complaint  Patient presents with   Pre-op Exam      ICD-10-CM   1. Hypertrophy of breast  N62        History of Present Illness: Vickie Morales is a 28 y.o.  female  with a history of macromastia.  She presents for preoperative evaluation for upcoming procedure, Bilateral Breast Reduction, scheduled for 12/25/2023 with Dr.  Waddell  The patient has not had problems with anesthesia. Patient states that she has never needed a mammogram before.  She reports that her aunt had breast cancer.  Patient denies any history of cardiac disease.  She denies taking any blood thinners.  Patient reports she is not a smoker.  Patient reports she is currently taking oral contraception.  She denies any history of miscarriages.  She denies any personal family history of blood clots or clotting diseases.  She denies any recent surgeries, traumas or infections.  She denies any history of stroke or heart attack.  She denies any history of Crohn's disease or ulcerative colitis.  She denies any history of COPD or asthma.  She denies any history of cancer.  She denies any varicosities to her lower extremities.  She denies any recent fevers, chills or changes in  her health.  Patient reports she is currently a 38 DDD cup, she states she would like to be around a C cup. Discussed with patient that cup size cannot be guaranteed. She expressed understanding.   Summary of Previous Visit: Patient was seen by Dr. Waddell on 09/27/2023.  At this visit, patient complained of upper back and neck pain due to the size of her breasts.On exam, her STN on the right was 36 cm and her STN on the left was 36 cm.  It was estimated that 700 g could be removed from each breast.  Estimated excess breast tissue to be removed at time of surgery: 700 grams  Job: Works as a Data processing manager, planning to take 2 weeks off. Discussed with patient she will  have restrictions for 6 weeks. She expressed understanding.   PMH Significant for: Tension headache, macromastia   Past Medical History: Allergies: Allergies  Allergen Reactions   Hydrocodone Swelling    Current Medications:  Current Outpatient Medications:    buPROPion (WELLBUTRIN XL) 150 MG 24 hr tablet, Take 150 mg by mouth daily., Disp: , Rfl:    norethindrone-ethinyl estradiol -FE (JUNEL FE 1/20) 1-20 MG-MCG tablet, Take 1 tablet by mouth daily., Disp: 28 tablet, Rfl: 11   ondansetron  (ZOFRAN ) 4 MG tablet, Take 1 tablet (4 mg total) by mouth every 8 (eight) hours as needed for up to 20 doses for nausea or vomiting., Disp: 20 tablet, Rfl: 0   oxyCODONE  (ROXICODONE ) 5 MG immediate release tablet, Take 1 tablet (5 mg total) by mouth every 6 (six) hours as needed for up to 20 doses for severe pain (pain score 7-10)., Disp: 20 tablet, Rfl: 0  Past Medical Problems: Past Medical History:  Diagnosis Date   Allergy    Anemia    Asthma    Headache     Past Surgical History: Past Surgical History:  Procedure Laterality Date   MOUTH SURGERY     TONSILLECTOMY AND ADENOIDECTOMY Bilateral 2011    Social History: Social History   Socioeconomic History   Marital status: Single    Spouse name: Not on file  Number of children: Not on file   Years of education: Not on file   Highest education level: Not on file  Occupational History   Not on file  Tobacco Use   Smoking status: Never   Smokeless tobacco: Never  Vaping Use   Vaping status: Never Used  Substance and Sexual Activity   Alcohol use: Yes    Comment: once every other week   Drug use: No   Sexual activity: Yes    Partners: Male    Birth control/protection: Pill  Other Topics Concern   Not on file  Social History Narrative   Vickie Morales is in twelfth grade at [employer]. She is doing well.    Living with her mother. She has three adult aged sisters that do not live in the home.    Dad was  hospitalized for alcohol abuse. Mother has Attention Deficit Disorder.   HC: 56.2 cm   Social Drivers of Corporate investment banker Strain: Not on file  Food Insecurity: Not on file  Transportation Needs: Not on file  Physical Activity: Not on file  Stress: Not on file  Social Connections: Not on file  Intimate Partner Violence: Not on file    Family History: Family History  Problem Relation Age of Onset   Migraines Mother    ADD / ADHD Mother    Depression Mother    Anxiety disorder Mother    Alcohol abuse Father    Migraines Maternal Grandmother    Asthma Maternal Grandfather     Review of Systems: Denies any recent fevers, chills, or changes in health  Physical Exam: Vital Signs BP 105/71 (BP Location: Left Arm, Patient Position: Sitting, Cuff Size: Normal)   Pulse 89   Resp (!) 97   Wt 200 lb (90.7 kg)   BMI 35.43 kg/m   Physical Exam  Constitutional:      General: Not in acute distress.    Appearance: Normal appearance. Not ill-appearing.  HENT:     Head: Normocephalic and atraumatic.  Neck:     Musculoskeletal: Normal range of motion.  Cardiovascular:     Rate and Rhythm: Normal rate Pulmonary:     Effort: Pulmonary effort is normal. No respiratory distress.  Musculoskeletal: Normal range of motion.  Skin:    General: Skin is warm and dry.     Findings: No erythema or rash.  Neurological:     Mental Status: Alert and oriented to person, place, and time. Mental status is at baseline.  Psychiatric:        Mood and Affect: Mood normal.        Behavior: Behavior normal.    Assessment/Plan: The patient is scheduled for bilateral breast reduction with Dr. Waddell.  Risks, benefits, and alternatives of procedure discussed, questions answered and consent obtained.    Smoking Status: Nonsmoker; Counseling Given? NA Last Mammogram: NA due to age  Caprini Score: 4; Risk Factors include: Age, BMI > 25, currently taking oral contraceptive and length of  planned surgery. Recommendation for mechanical pharmacological prophylaxis. Encourage early ambulation.   Pictures obtained: @consult   Post-op Rx sent to pharmacy: Oxycodone , Zofran   Discussed with the patient the increased risk of developing blood clots while taking oral birth control.  I recommended that she hold her birth control 2 weeks before and 2 weeks after surgery.  We also discussed the importance of ambulation after surgery to help prevent blood clots.  She expressed understanding.  Patient was provided  with the breast reduction and General Surgical Risk consent document and Pain Medication Agreement prior to their appointment.  They had adequate time to read through the risk consent documents and Pain Medication Agreement. We also discussed them in person together during this preop appointment. All of their questions were answered to their satisfaction.  Recommended calling if they have any further questions.  Risk consent form and Pain Medication Agreement to be scanned into patient's chart.  The risk that can be encountered with breast reduction were discussed and include the following but not limited to these:  Breast asymmetry, fluid accumulation, firmness of the breast, inability to breast feed, loss of nipple or areola, skin loss, decrease or no nipple sensation, fat necrosis of the breast tissue, bleeding, infection, healing delay.  There are risks of anesthesia, changes to skin sensation and injury to nerves or blood vessels.  The muscle can be temporarily or permanently injured.  You may have an allergic reaction to tape, suture, glue, blood products which can result in skin discoloration, swelling, pain, skin lesions, poor healing.  Any of these can lead to the need for revisonal surgery or stage procedures.  A reduction has potential to interfere with diagnostic procedures.  Nipple or breast piercing can increase risks of infection.  This procedure is best done when the breast is  fully developed.  Changes in the breast will continue to occur over time.  Pregnancy can alter the outcomes of previous breast reduction surgery, weight gain and weigh loss can also effect the long term appearance.     Electronically signed by: Estefana FORBES Peck, PA-C 12/10/2023 11:00 AM

## 2023-12-10 NOTE — Progress Notes (Signed)
 Patient ID: Vickie Morales, female    DOB: 24-Apr-1996, 28 y.o.   MRN: [id number]  Chief Complaint  Patient presents with   Pre-op Exam      ICD-10-CM   1. Hypertrophy of breast  N62        History of Present Illness: Vickie Morales is a 28 y.o.  female  with a history of macromastia.  She presents for preoperative evaluation for upcoming procedure, Bilateral Breast Reduction, scheduled for 12/25/2023 with Dr.  Waddell  The patient has not had problems with anesthesia. Patient states that she has never needed a mammogram before.  She reports that her aunt had breast cancer.  Patient denies any history of cardiac disease.  She denies taking any blood thinners.  Patient reports she is not a smoker.  Patient reports she is currently taking oral contraception.  She denies any history of miscarriages.  She denies any personal family history of blood clots or clotting diseases.  She denies any recent surgeries, traumas or infections.  She denies any history of stroke or heart attack.  She denies any history of Crohn's disease or ulcerative colitis.  She denies any history of COPD or asthma.  She denies any history of cancer.  She denies any varicosities to her lower extremities.  She denies any recent fevers, chills or changes in  her health.  Patient reports she is currently a 38 DDD cup, she states she would like to be around a C cup. Discussed with patient that cup size cannot be guaranteed. She expressed understanding.   Summary of Previous Visit: Patient was seen by Dr. Waddell on 09/27/2023.  At this visit, patient complained of upper back and neck pain due to the size of her breasts.On exam, her STN on the right was 36 cm and her STN on the left was 36 cm.  It was estimated that 700 g could be removed from each breast.  Estimated excess breast tissue to be removed at time of surgery: 700 grams  Job: Works as a Data processing manager, planning to take 2 weeks off. Discussed with patient she will  have restrictions for 6 weeks. She expressed understanding.   PMH Significant for: Tension headache, macromastia   Past Medical History: Allergies: Allergies  Allergen Reactions   Hydrocodone Swelling    Current Medications:  Current Outpatient Medications:    buPROPion (WELLBUTRIN XL) 150 MG 24 hr tablet, Take 150 mg by mouth daily., Disp: , Rfl:    norethindrone-ethinyl estradiol -FE (JUNEL FE 1/20) 1-20 MG-MCG tablet, Take 1 tablet by mouth daily., Disp: 28 tablet, Rfl: 11   ondansetron  (ZOFRAN ) 4 MG tablet, Take 1 tablet (4 mg total) by mouth every 8 (eight) hours as needed for up to 20 doses for nausea or vomiting., Disp: 20 tablet, Rfl: 0   oxyCODONE  (ROXICODONE ) 5 MG immediate release tablet, Take 1 tablet (5 mg total) by mouth every 6 (six) hours as needed for up to 20 doses for severe pain (pain score 7-10)., Disp: 20 tablet, Rfl: 0  Past Medical Problems: Past Medical History:  Diagnosis Date   Allergy    Anemia    Asthma    Headache     Past Surgical History: Past Surgical History:  Procedure Laterality Date   MOUTH SURGERY     TONSILLECTOMY AND ADENOIDECTOMY Bilateral 2011    Social History: Social History   Socioeconomic History   Marital status: Single    Spouse name: Not on file  Number of children: Not on file   Years of education: Not on file   Highest education level: Not on file  Occupational History   Not on file  Tobacco Use   Smoking status: Never   Smokeless tobacco: Never  Vaping Use   Vaping status: Never Used  Substance and Sexual Activity   Alcohol use: Yes    Comment: once every other week   Drug use: No   Sexual activity: Yes    Partners: Male    Birth control/protection: Pill  Other Topics Concern   Not on file  Social History Narrative   [name] is in twelfth grade at [employer]. She is doing well.    Living with her mother. She has three adult aged sisters that do not live in the home.    Dad was  hospitalized for alcohol abuse. Mother has Attention Deficit Disorder.   HC: 56.2 cm   Social Drivers of Corporate investment banker Strain: Not on file  Food Insecurity: Not on file  Transportation Needs: Not on file  Physical Activity: Not on file  Stress: Not on file  Social Connections: Not on file  Intimate Partner Violence: Not on file    Family History: Family History  Problem Relation Age of Onset   Migraines Mother    ADD / ADHD Mother    Depression Mother    Anxiety disorder Mother    Alcohol abuse Father    Migraines Maternal Grandmother    Asthma Maternal Grandfather     Review of Systems: Denies any recent fevers, chills, or changes in health  Physical Exam: Vital Signs BP 105/71 (BP Location: Left Arm, Patient Position: Sitting, Cuff Size: Normal)   Pulse 89   Resp (!) 97   Wt 200 lb (90.7 kg)   BMI 35.43 kg/m   Physical Exam  Constitutional:      General: Not in acute distress.    Appearance: Normal appearance. Not ill-appearing.  HENT:     Head: Normocephalic and atraumatic.  Neck:     Musculoskeletal: Normal range of motion.  Cardiovascular:     Rate and Rhythm: Normal rate Pulmonary:     Effort: Pulmonary effort is normal. No respiratory distress.  Musculoskeletal: Normal range of motion.  Skin:    General: Skin is warm and dry.     Findings: No erythema or rash.  Neurological:     Mental Status: Alert and oriented to person, place, and time. Mental status is at baseline.  Psychiatric:        Mood and Affect: Mood normal.        Behavior: Behavior normal.    Assessment/Plan: The patient is scheduled for bilateral breast reduction with Dr. Waddell.  Risks, benefits, and alternatives of procedure discussed, questions answered and consent obtained.    Smoking Status: Nonsmoker; Counseling Given? NA Last Mammogram: NA due to age  Caprini Score: 4; Risk Factors include: Age, BMI > 25, currently taking oral contraceptive and length of  planned surgery. Recommendation for mechanical pharmacological prophylaxis. Encourage early ambulation.   Pictures obtained: @consult   Post-op Rx sent to pharmacy: Oxycodone , Zofran   Discussed with the patient the increased risk of developing blood clots while taking oral birth control.  I recommended that she hold her birth control 2 weeks before and 2 weeks after surgery.  We also discussed the importance of ambulation after surgery to help prevent blood clots.  She expressed understanding.  Patient was provided  with the breast reduction and General Surgical Risk consent document and Pain Medication Agreement prior to their appointment.  They had adequate time to read through the risk consent documents and Pain Medication Agreement. We also discussed them in person together during this preop appointment. All of their questions were answered to their satisfaction.  Recommended calling if they have any further questions.  Risk consent form and Pain Medication Agreement to be scanned into patient's chart.  The risk that can be encountered with breast reduction were discussed and include the following but not limited to these:  Breast asymmetry, fluid accumulation, firmness of the breast, inability to breast feed, loss of nipple or areola, skin loss, decrease or no nipple sensation, fat necrosis of the breast tissue, bleeding, infection, healing delay.  There are risks of anesthesia, changes to skin sensation and injury to nerves or blood vessels.  The muscle can be temporarily or permanently injured.  You may have an allergic reaction to tape, suture, glue, blood products which can result in skin discoloration, swelling, pain, skin lesions, poor healing.  Any of these can lead to the need for revisonal surgery or stage procedures.  A reduction has potential to interfere with diagnostic procedures.  Nipple or breast piercing can increase risks of infection.  This procedure is best done when the breast is  fully developed.  Changes in the breast will continue to occur over time.  Pregnancy can alter the outcomes of previous breast reduction surgery, weight gain and weigh loss can also effect the long term appearance.     Electronically signed by: Estefana FORBES Peck, PA-C 12/10/2023 11:00 AM

## 2023-12-18 ENCOUNTER — Other Ambulatory Visit: Payer: Self-pay

## 2023-12-18 ENCOUNTER — Encounter (HOSPITAL_BASED_OUTPATIENT_CLINIC_OR_DEPARTMENT_OTHER): Payer: Self-pay | Admitting: Plastic Surgery

## 2023-12-21 DIAGNOSIS — Z719 Counseling, unspecified: Secondary | ICD-10-CM

## 2023-12-24 ENCOUNTER — Telehealth: Payer: Self-pay | Admitting: Plastic Surgery

## 2023-12-24 ENCOUNTER — Telehealth: Payer: Self-pay | Admitting: Surgical

## 2023-12-24 NOTE — Anesthesia Preprocedure Evaluation (Addendum)
 Anesthesia Evaluation  Patient identified by MRN, date of birth, ID band Patient awake    Reviewed: Allergy & Precautions, NPO status , Patient's Chart, lab work & pertinent test results  History of Anesthesia Complications Negative for: history of anesthetic complications  Airway Mallampati: I  TM Distance: >3 FB Neck ROM: Full    Dental  (+) Dental Advisory Given, Teeth Intact   Pulmonary asthma    Pulmonary exam normal        Cardiovascular negative cardio ROS Normal cardiovascular exam     Neuro/Psych  Headaches  negative psych ROS   GI/Hepatic negative GI ROS, Neg liver ROS,,,  Endo/Other   Obesity   Renal/GU negative Renal ROS     Musculoskeletal negative musculoskeletal ROS (+)    Abdominal   Peds  Hematology negative hematology ROS (+)   Anesthesia Other Findings   Reproductive/Obstetrics  Macromastia                               Anesthesia Physical Anesthesia Plan  ASA: 2  Anesthesia Plan: General   Post-op Pain Management: Tylenol  PO (pre-op)* and Celebrex  PO (pre-op)*   Induction: Intravenous  PONV Risk Score and Plan: 3 and Treatment may vary due to age or medical condition, Ondansetron , Dexamethasone , Midazolam  and Scopolamine  patch - Pre-op  Airway Management Planned: Oral ETT  Additional Equipment: None  Intra-op Plan:   Post-operative Plan: Extubation in OR  Informed Consent: I have reviewed the patients History and Physical, chart, labs and discussed the procedure including the risks, benefits and alternatives for the proposed anesthesia with the patient or authorized representative who has indicated his/her understanding and acceptance.     Dental advisory given  Plan Discussed with: CRNA and Anesthesiologist  Anesthesia Plan Comments:          Anesthesia Quick Evaluation

## 2023-12-24 NOTE — Telephone Encounter (Signed)
 Patient would like to know if she can take a benadryl , she has surgery tomorrow morning, please reach out

## 2023-12-24 NOTE — Telephone Encounter (Signed)
 Pt stated she had a missed call, unsure who tried to call but I provided her with this information from [facility]

## 2023-12-24 NOTE — Telephone Encounter (Signed)
 Patient can take benadryl , please notify.

## 2023-12-25 ENCOUNTER — Ambulatory Visit (HOSPITAL_BASED_OUTPATIENT_CLINIC_OR_DEPARTMENT_OTHER): Payer: Self-pay | Admitting: Anesthesiology

## 2023-12-25 ENCOUNTER — Encounter (HOSPITAL_BASED_OUTPATIENT_CLINIC_OR_DEPARTMENT_OTHER): Payer: Self-pay | Admitting: Plastic Surgery

## 2023-12-25 ENCOUNTER — Other Ambulatory Visit: Payer: Self-pay

## 2023-12-25 ENCOUNTER — Encounter (HOSPITAL_BASED_OUTPATIENT_CLINIC_OR_DEPARTMENT_OTHER)
Admission: RE | Disposition: A | Discharge status: Home / Self Care | Payer: Self-pay | Source: Home / Self Care | Attending: Plastic Surgery

## 2023-12-25 ENCOUNTER — Ambulatory Visit (HOSPITAL_BASED_OUTPATIENT_CLINIC_OR_DEPARTMENT_OTHER)
Admission: RE | Admit: 2023-12-25 | Discharge: 2023-12-25 | Disposition: A | Discharge status: Home / Self Care | Attending: Plastic Surgery | Admitting: Plastic Surgery

## 2023-12-25 DIAGNOSIS — M546 Pain in thoracic spine: Secondary | ICD-10-CM

## 2023-12-25 DIAGNOSIS — Z6835 Body mass index (BMI) 35.0-35.9, adult: Secondary | ICD-10-CM | POA: Insufficient documentation

## 2023-12-25 DIAGNOSIS — Z01818 Encounter for other preprocedural examination: Secondary | ICD-10-CM

## 2023-12-25 DIAGNOSIS — J45909 Unspecified asthma, uncomplicated: Secondary | ICD-10-CM | POA: Insufficient documentation

## 2023-12-25 DIAGNOSIS — N6031 Fibrosclerosis of right breast: Secondary | ICD-10-CM | POA: Insufficient documentation

## 2023-12-25 DIAGNOSIS — E669 Obesity, unspecified: Secondary | ICD-10-CM | POA: Insufficient documentation

## 2023-12-25 DIAGNOSIS — M542 Cervicalgia: Secondary | ICD-10-CM | POA: Insufficient documentation

## 2023-12-25 DIAGNOSIS — M549 Dorsalgia, unspecified: Secondary | ICD-10-CM | POA: Insufficient documentation

## 2023-12-25 DIAGNOSIS — N62 Hypertrophy of breast: Secondary | ICD-10-CM | POA: Diagnosis not present

## 2023-12-25 DIAGNOSIS — Z803 Family history of malignant neoplasm of breast: Secondary | ICD-10-CM | POA: Diagnosis not present

## 2023-12-25 DIAGNOSIS — N6032 Fibrosclerosis of left breast: Secondary | ICD-10-CM | POA: Diagnosis not present

## 2023-12-25 HISTORY — PX: BREAST REDUCTION SURGERY: SHX8

## 2023-12-25 LAB — POCT PREGNANCY, URINE: Preg Test, Ur: NEGATIVE

## 2023-12-25 SURGERY — MAMMOPLASTY, REDUCTION
Anesthesia: General | Site: Breast | Laterality: Bilateral

## 2023-12-25 MED ORDER — SODIUM CHLORIDE 0.9 % IV SOLN
12.5000 mg | INTRAVENOUS | Status: DC | PRN
  Filled 2023-12-25: qty 0.5

## 2023-12-25 MED ORDER — LACTATED RINGERS IV SOLN: INTRAVENOUS | Status: DC

## 2023-12-25 MED ORDER — FENTANYL CITRATE (PF) 100 MCG/2ML IJ SOLN
INTRAMUSCULAR | Status: AC
Start: 2023-12-25 — End: 2023-12-25
  Filled 2023-12-25: qty 2

## 2023-12-25 MED ORDER — ROCURONIUM BROMIDE 100 MG/10ML IV SOLN
INTRAVENOUS | Status: DC | PRN
  Administered 2023-12-25 (×2): 50 mg via INTRAVENOUS

## 2023-12-25 MED ORDER — SCOPOLAMINE 1 MG/3DAYS TD PT72
1.0000 | MEDICATED_PATCH | TRANSDERMAL | Status: DC
  Administered 2023-12-25 (×2): 1.5 mg via TRANSDERMAL

## 2023-12-25 MED ORDER — AMISULPRIDE (ANTIEMETIC) 5 MG/2ML IV SOLN
INTRAVENOUS | Status: AC
  Filled 2023-12-25: qty 4

## 2023-12-25 MED ORDER — PROPOFOL 10 MG/ML IV BOLUS
INTRAVENOUS | Status: DC | PRN
  Administered 2023-12-25 (×2): 200 mg via INTRAVENOUS

## 2023-12-25 MED ORDER — 0.9 % SODIUM CHLORIDE (POUR BTL) OPTIME
TOPICAL | Status: DC | PRN
  Administered 2023-12-25 (×6): 1000 mL

## 2023-12-25 MED ORDER — AMISULPRIDE (ANTIEMETIC) 5 MG/2ML IV SOLN
10.0000 mg | Freq: Once | INTRAVENOUS | Status: AC | PRN
  Administered 2023-12-25 (×2): 10 mg via INTRAVENOUS

## 2023-12-25 MED ORDER — LIDOCAINE HCL (CARDIAC) PF 100 MG/5ML IV SOSY
PREFILLED_SYRINGE | INTRAVENOUS | Status: DC | PRN
Start: 2023-12-25 — End: 2023-12-25
  Administered 2023-12-25 (×2): 80 mg via INTRAVENOUS

## 2023-12-25 MED ORDER — DEXAMETHASONE SODIUM PHOSPHATE 4 MG/ML IJ SOLN
INTRAMUSCULAR | Status: DC | PRN
  Administered 2023-12-25 (×2): 8 mg via INTRAVENOUS

## 2023-12-25 MED ORDER — SODIUM CHLORIDE (PF) 0.9 % IJ SOLN
INTRAMUSCULAR | Status: DC | PRN
  Administered 2023-12-25 (×4): 50 mL

## 2023-12-25 MED ORDER — CEFAZOLIN SODIUM-DEXTROSE 2-4 GM/100ML-% IV SOLN
2.0000 g | INTRAVENOUS | Status: AC
  Administered 2023-12-25 (×2): 2 g via INTRAVENOUS

## 2023-12-25 MED ORDER — FENTANYL CITRATE (PF) 100 MCG/2ML IJ SOLN
INTRAMUSCULAR | Status: AC
  Filled 2023-12-25: qty 2

## 2023-12-25 MED ORDER — FENTANYL CITRATE (PF) 100 MCG/2ML IJ SOLN
25.0000 ug | INTRAMUSCULAR | Status: DC | PRN
  Administered 2023-12-25 (×2): 50 ug via INTRAVENOUS

## 2023-12-25 MED ORDER — SCOPOLAMINE 1 MG/3DAYS TD PT72
MEDICATED_PATCH | TRANSDERMAL | Status: AC
  Filled 2023-12-25: qty 1

## 2023-12-25 MED ORDER — CEFAZOLIN SODIUM-DEXTROSE 2-4 GM/100ML-% IV SOLN
INTRAVENOUS | Status: AC
  Filled 2023-12-25: qty 100

## 2023-12-25 MED ORDER — CHLORHEXIDINE GLUCONATE CLOTH 2 % EX PADS: 6.0000 | MEDICATED_PAD | Freq: Once | CUTANEOUS | Status: DC

## 2023-12-25 MED ORDER — SUGAMMADEX SODIUM 200 MG/2ML IV SOLN
INTRAVENOUS | Status: DC | PRN
  Administered 2023-12-25 (×2): 200 mg via INTRAVENOUS

## 2023-12-25 MED ORDER — FENTANYL CITRATE (PF) 100 MCG/2ML IJ SOLN
INTRAMUSCULAR | Status: DC | PRN
  Administered 2023-12-25 (×2): 50 ug via INTRAVENOUS
  Administered 2023-12-25: 25 ug via INTRAVENOUS
  Administered 2023-12-25 (×2): 50 ug via INTRAVENOUS
  Administered 2023-12-25 (×4): 25 ug via INTRAVENOUS
  Administered 2023-12-25 (×4): 50 ug via INTRAVENOUS
  Administered 2023-12-25 (×3): 25 ug via INTRAVENOUS

## 2023-12-25 MED ORDER — DEXMEDETOMIDINE HCL IN NACL 80 MCG/20ML IV SOLN
INTRAVENOUS | Status: DC | PRN
  Administered 2023-12-25 (×4): 8 ug via INTRAVENOUS

## 2023-12-25 MED ORDER — DEXMEDETOMIDINE HCL IN NACL 80 MCG/20ML IV SOLN
INTRAVENOUS | Status: AC
  Filled 2023-12-25: qty 20

## 2023-12-25 MED ORDER — MIDAZOLAM HCL 2 MG/2ML IJ SOLN
INTRAMUSCULAR | Status: DC | PRN
  Administered 2023-12-25 (×2): 2 mg via INTRAVENOUS

## 2023-12-25 MED ORDER — KETAMINE HCL 50 MG/5ML IJ SOSY
PREFILLED_SYRINGE | INTRAMUSCULAR | Status: AC
  Filled 2023-12-25: qty 5

## 2023-12-25 MED ORDER — CELECOXIB 200 MG PO CAPS
200.0000 mg | ORAL_CAPSULE | Freq: Once | ORAL | Status: AC
  Administered 2023-12-25 (×2): 200 mg via ORAL

## 2023-12-25 MED ORDER — KETAMINE HCL 10 MG/ML IJ SOLN
INTRAMUSCULAR | Status: DC | PRN
  Administered 2023-12-25 (×2): 25 mg via INTRAVENOUS

## 2023-12-25 MED ORDER — PROPOFOL 500 MG/50ML IV EMUL
INTRAVENOUS | Status: DC | PRN
  Administered 2023-12-25 (×2): 40 ug/kg/min via INTRAVENOUS

## 2023-12-25 MED ORDER — ONDANSETRON HCL 4 MG/2ML IJ SOLN
INTRAMUSCULAR | Status: DC | PRN
  Administered 2023-12-25 (×2): 4 mg via INTRAVENOUS

## 2023-12-25 MED ORDER — CELECOXIB 200 MG PO CAPS
ORAL_CAPSULE | ORAL | Status: AC
  Filled 2023-12-25: qty 1

## 2023-12-25 MED ORDER — ACETAMINOPHEN 500 MG PO TABS
1000.0000 mg | ORAL_TABLET | Freq: Once | ORAL | Status: AC
  Administered 2023-12-25 (×2): 1000 mg via ORAL

## 2023-12-25 MED ORDER — MIDAZOLAM HCL 2 MG/2ML IJ SOLN
INTRAMUSCULAR | Status: AC
  Filled 2023-12-25: qty 2

## 2023-12-25 MED ORDER — ACETAMINOPHEN 500 MG PO TABS
ORAL_TABLET | ORAL | Status: AC
  Filled 2023-12-25: qty 2

## 2023-12-25 SURGICAL SUPPLY — 58 items
BINDER BREAST 3XL (GAUZE/BANDAGES/DRESSINGS) IMPLANT
BINDER BREAST LRG (GAUZE/BANDAGES/DRESSINGS) IMPLANT
BINDER BREAST MEDIUM (GAUZE/BANDAGES/DRESSINGS) IMPLANT
BINDER BREAST XLRG (GAUZE/BANDAGES/DRESSINGS) IMPLANT
BINDER BREAST XXLRG (GAUZE/BANDAGES/DRESSINGS) IMPLANT
BIOPATCH RED 1 DISK 7.0 (GAUZE/BANDAGES/DRESSINGS) ×2 IMPLANT
BLADE HEX COATED 2.75 (ELECTRODE) IMPLANT
BLADE SURG 10 STRL SS (BLADE) ×6 IMPLANT
BLADE SURG 15 STRL LF DISP TIS (BLADE) ×1 IMPLANT
CANISTER SUCT 1200ML W/VALVE (MISCELLANEOUS) IMPLANT
COTTONBALL LRG STERILE PKG (GAUZE/BANDAGES/DRESSINGS) IMPLANT
DERMABOND ADVANCED .7 DNX12 (GAUZE/BANDAGES/DRESSINGS) ×4 IMPLANT
DRAIN CHANNEL 19F RND (DRAIN) ×2 IMPLANT
DRAPE IMP U-DRAPE 54X76 (DRAPES) IMPLANT
DRAPE UTILITY XL STRL (DRAPES) ×1 IMPLANT
DRSG TEGADERM 4X4.75 (GAUZE/BANDAGES/DRESSINGS) ×2 IMPLANT
ELECTRODE BLDE 4.0 EZ CLN MEGD (MISCELLANEOUS) ×1 IMPLANT
ELECTRODE REM PT RTRN 9FT ADLT (ELECTROSURGICAL) ×2 IMPLANT
EVACUATOR SILICONE 100CC (DRAIN) ×2 IMPLANT
GAUZE PAD ABD 8X10 STRL (GAUZE/BANDAGES/DRESSINGS) ×2 IMPLANT
GAUZE SPONGE 2X2 STRL 8-PLY (GAUZE/BANDAGES/DRESSINGS) ×2 IMPLANT
GAUZE XEROFORM 5X9 LF (GAUZE/BANDAGES/DRESSINGS) IMPLANT
GLOVE BIO SURGEON STRL SZ 6.5 (GLOVE) IMPLANT
GLOVE BIO SURGEON STRL SZ7.5 (GLOVE) IMPLANT
GLOVE BIO SURGEON STRL SZ8 (GLOVE) ×1 IMPLANT
GLOVE BIOGEL PI IND STRL 7.0 (GLOVE) IMPLANT
GLOVE BIOGEL PI IND STRL 8 (GLOVE) ×1 IMPLANT
GOWN STRL REUS W/ TWL LRG LVL3 (GOWN DISPOSABLE) ×1 IMPLANT
GOWN STRL REUS W/TWL XL LVL3 (GOWN DISPOSABLE) ×1 IMPLANT
HIBICLENS CHG 4% 4OZ BTL (MISCELLANEOUS) ×1 IMPLANT
MANIFOLD NEPTUNE II (INSTRUMENTS) ×1 IMPLANT
MARKER SKIN DUAL TIP RULER LAB (MISCELLANEOUS) ×1 IMPLANT
NDL HYPO 22X1.5 SAFETY MO (MISCELLANEOUS) ×2 IMPLANT
NEEDLE HYPO 22X1.5 SAFETY MO (MISCELLANEOUS) ×2 IMPLANT
NS IRRIG 1000ML POUR BTL (IV SOLUTION) ×1 IMPLANT
PACK BASIN DAY SURGERY FS (CUSTOM PROCEDURE TRAY) ×1 IMPLANT
PACK UNIVERSAL I (CUSTOM PROCEDURE TRAY) ×1 IMPLANT
PENCIL SMOKE EVACUATOR (MISCELLANEOUS) ×2 IMPLANT
PIN SAFETY STERILE (MISCELLANEOUS) ×1 IMPLANT
SLEEVE SCD COMPRESS KNEE MED (STOCKING) ×1 IMPLANT
SPONGE T-LAP 18X18 ~~LOC~~+RFID (SPONGE) ×3 IMPLANT
STAPLER SKIN PROX WIDE 3.9 (STAPLE) ×1 IMPLANT
STRIP CLOSURE SKIN 1/2X4 (GAUZE/BANDAGES/DRESSINGS) IMPLANT
SUT MNCRL AB 3-0 PS2 27 (SUTURE) ×4 IMPLANT
SUT MNCRL AB 4-0 PS2 18 (SUTURE) ×4 IMPLANT
SUT MON AB 2-0 CT1 36 (SUTURE) ×1 IMPLANT
SUT MON AB 5-0 PS2 18 (SUTURE) IMPLANT
SUT SILK 2 0 SH (SUTURE) ×2 IMPLANT
SUT VIC AB 3-0 SH 27X BRD (SUTURE) IMPLANT
SYR 20ML LL LF (SYRINGE) ×2 IMPLANT
SYR BULB IRRIG 60ML STRL (SYRINGE) IMPLANT
SYR CONTROL 10ML LL (SYRINGE) ×1 IMPLANT
TAPE MEASURE VINYL STERILE (MISCELLANEOUS) IMPLANT
TOWEL GREEN STERILE FF (TOWEL DISPOSABLE) ×2 IMPLANT
TRAY DSU PREP LF (CUSTOM PROCEDURE TRAY) ×1 IMPLANT
TUBE CONNECTING 20X1/4 (TUBING) ×1 IMPLANT
UNDERPAD 30X36 HEAVY ABSORB (UNDERPADS AND DIAPERS) ×2 IMPLANT
YANKAUER SUCT BULB TIP NO VENT (SUCTIONS) ×1 IMPLANT

## 2023-12-25 NOTE — Anesthesia Procedure Notes (Signed)
 Procedure Name: Intubation Date/Time: 12/25/2023 7:44 AM  Performed by: Donnell Berwyn SQUIBB, CRNAPre-anesthesia Checklist: Patient identified, Emergency Drugs available, Suction available, Patient being monitored and Timeout performed Patient Re-evaluated:Patient Re-evaluated prior to induction Oxygen Delivery Method: Circle system utilized Preoxygenation: Pre-oxygenation with 100% oxygen Induction Type: IV induction Ventilation: Mask ventilation without difficulty Laryngoscope Size: Mac and 3 Grade View: Grade I Tube type: Oral Tube size: 7.0 mm Number of attempts: 1 Airway Equipment and Method: Stylet Placement Confirmation: positive ETCO2, ETT inserted through vocal cords under direct vision and breath sounds checked- equal and bilateral Secured at: 21 cm Tube secured with: Tape Dental Injury: Teeth and Oropharynx as per pre-operative assessment

## 2023-12-25 NOTE — Transfer of Care (Signed)
 Immediate Anesthesia Transfer of Care Note  Patient: Vickie Morales  Procedure(s) Performed: MAMMOPLASTY, REDUCTION (Bilateral: Breast)  Patient Location: PACU  Anesthesia Type:General  Level of Consciousness: awake, alert , oriented, and patient cooperative  Airway & Oxygen Therapy: Patient Spontanous Breathing and Patient connected to nasal cannula oxygen  Post-op Assessment: Report given to RN and Post -op Vital signs reviewed and stable  Post vital signs: Reviewed and stable  Last Vitals:  Vitals Value Taken Time  BP 129/58 12/25/23 11:12  Temp    Pulse 96 12/25/23 11:14  Resp 18 12/25/23 11:14  SpO2 100 % 12/25/23 11:14  Vitals shown include unfiled device data.  Last Pain:  Vitals:   12/25/23 0630  TempSrc: Temporal  PainSc: 0-No pain      Patients Stated Pain Goal: 4 (12/25/23 0630)  Complications: No notable events documented.

## 2023-12-25 NOTE — Discharge Instructions (Addendum)
 1.  Call the office for any questions or concerns 2.  May shower tomorrow afternoon. 3.  Please record drain output 4.  Out of bed ambulating today please 5.  Binder at all times unless showering or washing garment  No Tylenol  or Ibuprofen  until after 1:30pm today.  Post Anesthesia Home Care Instructions  Activity: Get plenty of rest for the remainder of the day. A responsible individual must stay with you for 24 hours following the procedure.  For the next 24 hours, DO NOT: -Drive a car -Advertising copywriter -Drink alcoholic beverages -Take any medication unless instructed by your physician -Make any legal decisions or sign important papers.  Meals: Start with liquid foods such as gelatin or soup. Progress to regular foods as tolerated. Avoid greasy, spicy, heavy foods. If nausea and/or vomiting occur, drink only clear liquids until the nausea and/or vomiting subsides. Call your physician if vomiting continues.  Special Instructions/Symptoms: Your throat may feel dry or sore from the anesthesia or the breathing tube placed in your throat during surgery. If this causes discomfort, gargle with warm salt water. The discomfort should disappear within 24 hours.  If you had a scopolamine  patch placed behind your ear for the management of post- operative nausea and/or vomiting:  1. The medication in the patch is effective for 72 hours, after which it should be removed.  Wrap patch in a tissue and discard in the trash. Wash hands thoroughly with soap and water. 2. You may remove the patch earlier than 72 hours if you experience unpleasant side effects which may include dry mouth, dizziness or visual disturbances. 3. Avoid touching the patch. Wash your hands with soap and water after contact with the patch.   Information for Discharge Teaching: EXPAREL  (bupivacaine  liposome injectable suspension)   Pain relief is important to your recovery. The goal is to control your pain so you can move  easier and return to your normal activities as soon as possible after your procedure. Your physician may use several types of medicines to manage pain, swelling, and more.  Your surgeon or anesthesiologist gave you EXPAREL (bupivacaine ) to help control your pain after surgery.  EXPAREL  is a local anesthetic designed to release slowly over an extended period of time to provide pain relief by numbing the tissue around the surgical site. EXPAREL  is designed to release pain medication over time and can control pain for up to 72 hours. Depending on how you respond to EXPAREL , you may require less pain medication during your recovery. EXPAREL  can help reduce or eliminate the need for opioids during the first few days after surgery when pain relief is needed the most. EXPAREL  is not an opioid and is not addictive. It does not cause sleepiness or sedation.   Important! A teal colored band has been placed on your arm with the date, time and amount of EXPAREL  you have received. Please leave this armband in place for the full 96 hours following administration, and then you may remove the band. If you return to the hospital for any reason within 96 hours following the administration of EXPAREL , the armband provides important information that your health care providers to know, and alerts them that you have received this anesthetic.    Possible side effects of EXPAREL : Temporary loss of sensation or ability to move in the area where medication was injected. Nausea, vomiting, constipation Rarely, numbness and tingling in your mouth or lips, lightheadedness, or anxiety may occur. Call your doctor right away if you think  you may be experiencing any of these sensations, or if you have other questions regarding possible side effects.  Follow all other discharge instructions given to you by your surgeon or nurse. Eat a healthy diet and drink plenty of water or other fluids.About my Jackson-Pratt Bulb Drain  What is  a Jackson-Pratt bulb? A Jackson-Pratt is a soft, round device used to collect drainage. It is connected to a long, thin drainage catheter, which is held in place by one or two small stiches near your surgical incision site. When the bulb is squeezed, it forms a vacuum, forcing the drainage to empty into the bulb.  Emptying the Jackson-Pratt bulb- To empty the bulb: 1. Release the plug on the top of the bulb. 2. Pour the bulb's contents into a measuring container which your nurse will provide. 3. Record the time emptied and amount of drainage. Empty the drain(s) as often as your     doctor or nurse recommends.  Date                  Time                    Amount (Drain 1)                 Amount (Drain 2)  _____________________________________________________________________  _____________________________________________________________________  _____________________________________________________________________  _____________________________________________________________________  _____________________________________________________________________  _____________________________________________________________________  _____________________________________________________________________  _____________________________________________________________________  Squeezing the Jackson-Pratt Bulb- To squeeze the bulb: 1. Make sure the plug at the top of the bulb is open. 2. Squeeze the bulb tightly in your fist. You will hear air squeezing from the bulb. 3. Replace the plug while the bulb is squeezed. 4. Use a safety pin to attach the bulb to your clothing. This will keep the catheter from     pulling at the bulb insertion site.  When to call your doctor- Call your doctor if: Drain site becomes red, swollen or hot. You have a fever greater than 101 degrees F. There is oozing at the drain site. Drain falls out (apply a guaze bandage over the drain hole and secure it with  tape). Drainage increases daily not related to activity patterns. (You will usually have more drainage when you are active than when you are resting.) Drainage has a bad odor.

## 2023-12-25 NOTE — Op Note (Signed)
 DATE OF OPERATION: 12/25/2023   LOCATION: [facility] operating Room   PREOPERATIVE DIAGNOSIS: Symptomatic macromastia   POSTOPERATIVE DIAGNOSIS: Same   PROCEDURE: Bilateral breast reduction   SURGEON: Marinell Birmingham, MD   ASSISTANT: Honora Seip   EBL: 150 cc   CONDITION: Stable   COMPLICATIONS: None   INDICATION: The patient, Vickie Morales, is a 28 y.o. female born on May 03, 1996, is here for treatment upper back and neck pain secondary to large breast size.     PROCEDURE DETAILS:  The patient was seen prior to surgery and marked.  IV antibiotics were given. The patient was taken to the operating room,SCDs were placed, and given a general anesthetic. A standard time out was performed and all information was confirmed by those in the room.    The chest was prepped and draped in usual sterile manner.  A 42 mm cookie cutter was used to outline the proposed nipple areolar complexes bilaterally and an 8 cm based inferior pedicle was outlined on each breast.  The right breast was addressed first.  Laparotomy tape was placed at the base of breast as a tourniquet and the pedicle was de-epithelialized sharply.  Electrocautery was used to dissect the borders of the pedicle to the chest wall.  Electrocautery was then used to resect the medial lateral and superior  triangles of the breast tissue and to develop within the superior skin flap.  The breast tissue removed constituted the bulk of the reduction and weighed 650gms.  The subfascial space over the pectoralis muscle and the subcutaneous tissues were infiltrated with 50 mL of a mixture of Exparel , quarter percent plain Marcaine , and saline.  The surgical wound was irrigated and hemostasis ensured with electrocautery.  A 19 French round drain was placed behind the pedicle and brought out through a separate stab incision.  The T point was approximated with a single 2-0 Monocryl suture and the skin edges were tailor tacked in place with  skin clips.  Dermis was approximated with interrupted and running 3-0 Monocryl sutures and the skin was closed with a running 4-0 Monocryl subcuticular stitch.  Attention was turned to the left breast where similar procedure was performed.  After placing a laparotomy tape at the base of the breast as a tourniquet the pedicle was de-epithelialized sharply and dissected to the chest wall with electrocautery.  Medial lateral and superior triangles of breast tissue were resected and the superior skin flap was developed and thinned.  The breast tissue removed constituted the bulk of the reduction and weighed 655 gms.  All breast tissue was sent to pathology for routine examination.  Again the subfascial space was infiltrated with the Exparel  mixture and the surgical bed irrigated.  After ensuring hemostasis a 19 French round drain was placed behind the pedicle and brought out through a separate stab incision. The T point was approximated with a 2-0 Monocryl suture and the skin edges were tailor tacked in place with skin clips.  Dermis was closed with interrupted and running 3-0 Monocryl sutures and skin was closed with running 4-0 Monocryl subcuticular stitch.  All incisions were then sealed with Dermabond the patient was placed in a supportive, compressive garment.  She was awakened from anesthesia without incident and transferred to the recovery room in good condition.  All instrument needle and sponge counts were reported as correct and there were no complications appreciated during the procedure. The patient was allowed to wake up and taken to recovery room in stable  condition at the end of the case. The family was notified at the end of the case.    The advanced practice practitioner (APP) assisted throughout the case.  The APP was essential in retraction and counter traction when needed to make the case progress smoothly.  This retraction and assistance made it possible to see the tissue plans for the procedure.   The assistance was needed for blood control, tissue re-approximation and assisted with closure of the incision site.

## 2023-12-25 NOTE — Interval H&P Note (Signed)
 History and Physical Interval Note: No change in exam or indication for surgery All questions answered Marked for a bilateral breast reduction with her assistance Will proceed at her request  12/25/2023 7:09 AM  Catlin E Marcussen  has presented today for surgery, with the diagnosis of n62.  The various methods of treatment have been discussed with the patient and family. After consideration of risks, benefits and other options for treatment, the patient has consented to  Procedure(s): MAMMOPLASTY, REDUCTION (Bilateral) as a surgical intervention.  The patient's history has been reviewed, patient examined, no change in status, stable for surgery.  I have reviewed the patient's chart and labs.  Questions were answered to the patient's satisfaction.     Leonce KATHEE Birmingham

## 2023-12-25 NOTE — Anesthesia Postprocedure Evaluation (Signed)
 Anesthesia Post Note  Patient: Vickie Morales  Procedure(s) Performed: MAMMOPLASTY, REDUCTION (Bilateral: Breast)     Patient location during evaluation: PACU Anesthesia Type: General Level of consciousness: awake and alert Pain management: pain level controlled Vital Signs Assessment: post-procedure vital signs reviewed and stable Respiratory status: spontaneous breathing, nonlabored ventilation and respiratory function stable Cardiovascular status: stable and blood pressure returned to baseline Anesthetic complications: no   No notable events documented.  Last Vitals:  Vitals:   12/25/23 1245 12/25/23 1259  BP: 118/75 111/81  Pulse: 79 83  Resp: 12 16  Temp:  36.6 C  SpO2: 98% 99%    Last Pain:  Vitals:   12/25/23 1259  TempSrc: Temporal  PainSc: 4                  Debby FORBES Like

## 2023-12-26 ENCOUNTER — Ambulatory Visit (INDEPENDENT_AMBULATORY_CARE_PROVIDER_SITE_OTHER): Admitting: Surgical

## 2023-12-26 ENCOUNTER — Encounter (HOSPITAL_BASED_OUTPATIENT_CLINIC_OR_DEPARTMENT_OTHER): Payer: Self-pay | Admitting: Plastic Surgery

## 2023-12-26 VITALS — BP 118/80 | HR 87 | Ht 63.0 in | Wt 200.4 lb

## 2023-12-26 DIAGNOSIS — M546 Pain in thoracic spine: Secondary | ICD-10-CM

## 2023-12-26 DIAGNOSIS — N62 Hypertrophy of breast: Secondary | ICD-10-CM

## 2023-12-26 LAB — SURGICAL PATHOLOGY

## 2023-12-26 NOTE — Progress Notes (Signed)
 Patient is a 28 y.o.-year-old female status post bilateral breast reduction with Dr.  Waddell. Patient is 1 day postop.  Patient presents with her mom.  She reports she is overall doing well, reports she has had normal bowel movement since surgery, urinating normally.  She has been up and moving without issue.  Pain is well-controlled at this time.  She does not have any infectious symptoms.  She has no specific concerns.  Chaperone present on exam BP 118/80 (BP Location: Left Arm, Patient Position: Sitting, Cuff Size: Normal)   Pulse 87   Ht 5' 3 (1.6 m)   Wt 200 lb 6.4 oz (90.9 kg)   LMP 12/03/2023 (Exact Date)   SpO2 97%   BMI 35.50 kg/m  Bilateral NAC's are viable, bilateral breast incisions are intact.  NAC sensation intact There is no erythema or cellulitic changes noted. She does have some swelling of bilateral lateral breast, but no obvious subcutaneous fluid collections noted with palpation.  A/P:  JP drains were removed.  Tolerated this well.  Recommend continuing with compressive garment 24/7 until 6 weeks post-op,  avoiding strenuous activity/heavy lifting until 6 weeks post-op  Recommend following up in 1 week  All of the patient's questions were answered to their content. Recommend calling with any questions or concerns.

## 2023-12-28 ENCOUNTER — Encounter: Payer: Self-pay | Admitting: Plastic Surgery

## 2023-12-31 ENCOUNTER — Ambulatory Visit (INDEPENDENT_AMBULATORY_CARE_PROVIDER_SITE_OTHER): Admitting: Plastic Surgery

## 2023-12-31 ENCOUNTER — Encounter: Payer: Self-pay | Admitting: Plastic Surgery

## 2023-12-31 VITALS — BP 118/81 | HR 95 | Ht 63.0 in | Wt 194.4 lb

## 2023-12-31 DIAGNOSIS — Z9889 Other specified postprocedural states: Secondary | ICD-10-CM

## 2023-12-31 NOTE — Progress Notes (Signed)
 Vickie Morales returns today 6 days postop from a bilateral breast reduction.  Her only complaints are itching on the upper portion of the right breast.  She did have some very thin drainage from that breast also.  Otherwise she is overall happy.  On examination there is ecchymoses on the lower pole of the right breast.  The breast is soft and nontender to palpation.  All incisions are clean dry and intact.  There is no erythema of either breast.  The nipples are warm and well-perfused.  Patient is reassured that everything looks good.  She may change of her binder to a bra of her choosing.  Keep scheduled appointment.

## 2024-01-02 ENCOUNTER — Encounter: Admitting: Plastic Surgery

## 2024-01-17 ENCOUNTER — Encounter: Payer: Self-pay | Admitting: Plastic Surgery

## 2024-01-21 NOTE — Telephone Encounter (Signed)
 I can see this patient today if she is still wanting to be evaluated

## 2024-01-22 ENCOUNTER — Encounter: Admitting: Student

## 2024-01-22 ENCOUNTER — Ambulatory Visit
Admission: EM | Admit: 2024-01-22 | Discharge: 2024-01-22 | Disposition: A | Discharge status: Home / Self Care | Attending: Emergency Medicine | Admitting: Emergency Medicine

## 2024-01-22 ENCOUNTER — Encounter: Payer: Self-pay | Admitting: Emergency Medicine

## 2024-01-22 DIAGNOSIS — S21002A Unspecified open wound of left breast, initial encounter: Secondary | ICD-10-CM | POA: Diagnosis not present

## 2024-01-22 MED ORDER — DOXYCYCLINE HYCLATE 100 MG PO CAPS: 100.0000 mg | ORAL_CAPSULE | Freq: Two times a day (BID) | ORAL | 0 refills | Status: AC

## 2024-01-22 NOTE — Discharge Instructions (Signed)
 Today wound was evaluated, on exam able to smell foul odor and see puslike drainage and therefore you will be started on antibiotic  Wound is located below the incision and  Clean with unscented soap and water, pat and do not rub, pat dry and then cover with a nonstick dressing, may apply Neosporin if you would like  Begin doxycycline  twice daily for 10 days to clear out any germs contributing to symptoms  Please keep upcoming follow-up appointment in which they will instruct you on further wound care

## 2024-01-22 NOTE — ED Triage Notes (Addendum)
 Patient had breast reduction surgery on 12/25/23. Patient reports abscess under left breast with yellow drainage. Rates pain 7 /10. Rt Patient report she has been using neosporin on wound.

## 2024-01-22 NOTE — ED Provider Notes (Signed)
 Vickie Morales    CSN: [id number] Arrival date & time: 01/22/24  1752      History   Chief Complaint Chief Complaint  Patient presents with  . Abscess    HPI Vickie Morales is a 28 y.o. female.     Past Medical History:  Diagnosis Date  . Allergy   . Anemia   . Asthma   . Headache     Patient Active Problem List   Diagnosis Date Noted  . ASCUS of cervix with negative high risk HPV 03/01/2023  . Oral contraceptive use 02/26/2023  . Biological false positive RPR test 01/07/2022  . Tension headache, chronic 04/23/2015    Past Surgical History:  Procedure Laterality Date  . BREAST REDUCTION SURGERY Bilateral 12/25/2023   Procedure: MAMMOPLASTY, REDUCTION;  Surgeon: Waddell Leonce NOVAK, MD;  Location: [facility];  Service: Plastics;  Laterality: Bilateral;  . MOUTH SURGERY    . TONSILLECTOMY AND ADENOIDECTOMY Bilateral 2011    OB History     Gravida  0   Para  0   Term  0   Preterm  0   AB  0   Living  0      SAB  0   IAB  0   Ectopic  0   Multiple  0   Live Births  0            Home Medications    Prior to Admission medications   Medication Sig Start Date End Date Taking? Authorizing Provider  doxycycline  (VIBRAMYCIN ) 100 MG capsule Take 1 capsule (100 mg total) by mouth 2 (two) times daily. 01/22/24  Yes Sherylann Vangorden R, NP  buPROPion (WELLBUTRIN XL) 150 MG 24 hr tablet Take 150 mg by mouth daily.    [provider]  norethindrone-ethinyl estradiol -FE (JUNEL FE 1/20) 1-20 MG-MCG tablet Take 1 tablet by mouth daily. 02/26/23   Leftwich-Kirby, Olam LABOR, CNM  ondansetron  (ZOFRAN ) 4 MG tablet Take 1 tablet (4 mg total) by mouth every 8 (eight) hours as needed for up to 20 doses for nausea or vomiting. Patient not taking: Reported on 12/31/2023 12/10/23   Andris Estefana FORBES, PA-C  oxyCODONE  (ROXICODONE ) 5 MG immediate release tablet Take 1 tablet (5 mg total) by mouth every 6 (six) hours as needed for up to 20  doses for severe pain (pain score 7-10). Patient not taking: Reported on 12/31/2023 12/10/23   Andris Estefana FORBES, PA-C    Family History Family History  Problem Relation Age of Onset  . Migraines Mother   . ADD / ADHD Mother   . Depression Mother   . Anxiety disorder Mother   . Alcohol abuse Father   . Migraines Maternal Grandmother   . Asthma Maternal Grandfather     Social History Social History   Tobacco Use  . Smoking status: Never  . Smokeless tobacco: Never  Vaping Use  . Vaping status: Never Used  Substance Use Topics  . Alcohol use: Yes    Comment: once every other week  . Drug use: No     Allergies   Hydrocodone   Review of Systems Review of Systems   Physical Exam Triage Vital Signs ED Triage Vitals  Encounter Vitals Group     BP 01/22/24 1846 104/71     Girls Systolic BP Percentile --      Girls Diastolic BP Percentile --      Boys Systolic BP Percentile --  Boys Diastolic BP Percentile --      Pulse Rate 01/22/24 1846 85     Resp 01/22/24 1846 18     Temp 01/22/24 1846 98.4 F (36.9 C)     Temp Source 01/22/24 1846 Oral     SpO2 01/22/24 1846 99 %     Weight --      Height --      Head Circumference --      Peak Flow --      Pain Score 01/22/24 1842 7     Pain Loc --      Pain Education --      Exclude from Growth Chart --    No data found.  Updated Vital Signs BP 104/71 (BP Location: Right Arm)   Pulse 85   Temp 98.4 F (36.9 C) (Oral)   Resp 18   LMP 12/29/2023 (Exact Date)   SpO2 99%   Visual Acuity Right Eye Distance:   Left Eye Distance:   Bilateral Distance:    Right Eye Near:   Left Eye Near:    Bilateral Near:     Physical Exam   UC Treatments / Results  Labs (all labs ordered are listed, but only abnormal results are displayed) Labs Reviewed - No data to display  EKG   Radiology No results found.  Procedures Procedures (including critical care time)  Medications Ordered in UC Medications - No  data to display  Initial Impression / Assessment and Plan / UC Course  I have reviewed the triage vital signs and the nursing notes.  Pertinent labs & imaging results that were available during my care of the patient were reviewed by me and considered in my medical decision making (see chart for details).     *** Final Clinical Impressions(s) / UC Diagnoses   Final diagnoses:  Wound of left breast, initial encounter     Discharge Instructions      Today wound was evaluated, on exam able to smell foul odor and see puslike drainage and therefore you will be started on antibiotic  Wound is located below the incision and  Clean with unscented soap and water, pat and do not rub, pat dry and then cover with a nonstick dressing, may apply Neosporin if you would like  Begin doxycycline  twice daily for 10 days to clear out any germs contributing to symptoms  Please keep upcoming follow-up appointment in which they will instruct you on further wound care     ED Prescriptions     Medication Sig Dispense Auth. Provider   doxycycline  (VIBRAMYCIN ) 100 MG capsule Take 1 capsule (100 mg total) by mouth 2 (two) times daily. 20 capsule Almedia Cordell R, NP      PDMP not reviewed this encounter.

## 2024-01-23 NOTE — Progress Notes (Signed)
 Patient is a 28 year old female who underwent bilateral breast reduction with Dr. Waddell on 12/25/2023.  Patient is a little over 4 weeks postop.  She presents to the clinic today for postoperative follow-up.  Patient was last seen in the clinic on 12/31/2023.  At this visit, patient was doing well.  On exam, there is ecchymosis on the lower pole of the right breast.  The breast was soft and nontender to palpation.  All the incisions were clean dry and intact.  Nipples were warm and well-perfused.  Today, patient reports she is overall doing well.  She states that she did go to an urgent care the other day with concerns about a wound to her left breast.  She states that over the weekend she noticed it and felt like it was worsening, so she went to urgent care to have it checked.  She states that it was cleaned at urgent care and they recommended that the patient continue to monitor the area and keep the area clean.  Patient states that she was prescribed doxycycline , but has not started it yet.  She denies any fevers or chills.  She denies any drainage from the area.  She denies any other issues or concerns.  Chaperone present on exam.  On exam, patient is sitting upright in no acute distress.  Breasts are overall soft and symmetric.  There is no overlying erythema.  No obvious fluid collections palpated on exam.  NAC's appear to be healthy bilaterally.  To the left inferior T-zone, there is an area of breakdown with a wound that is approximately 1 cm x 0.5 cm x 0.15 cm.  Just lateral to this wound, there is a very small wound noted as well.  There is no erythema or drainage noted.  Remainder of the incisions are intact and healing well.  There were a few Monocryl sutures that were noted.  These were cut and removed.  Patient tolerated well.  There are no signs of infection on exam.  I discussed with the patient that at this time, there are no signs of infection.  Recommended that she hold off on the  doxycycline .  I discussed with the patient that she may clean the wounds with Vashe soaked gauze for 5 minutes today, and then apply Xeroform dressing over the wounds once daily.  Recommended that she apply Vaseline throughout the remainder of her incisions.  Patient expressed understanding.  I discussed with the patient to continue with her sports bra and avoid strenuous activities.  We will plan to see the patient back in about 2 to 3 weeks.  I instructed her to call if she has any questions or concerns about anything.  Pictures were obtained of the patient and placed in the chart with the patient's or guardian's permission.

## 2024-01-24 ENCOUNTER — Ambulatory Visit (INDEPENDENT_AMBULATORY_CARE_PROVIDER_SITE_OTHER): Admitting: Student

## 2024-01-24 ENCOUNTER — Telehealth: Payer: Self-pay

## 2024-01-24 ENCOUNTER — Encounter: Payer: Self-pay | Admitting: Student

## 2024-01-24 VITALS — BP 104/71 | HR 81

## 2024-01-24 DIAGNOSIS — Z9889 Other specified postprocedural states: Secondary | ICD-10-CM

## 2024-01-24 NOTE — Telephone Encounter (Signed)
Faxed wound care supply request to [facility] with confirmed receipt.

## 2024-01-28 ENCOUNTER — Encounter: Admitting: Surgical

## 2024-01-31 ENCOUNTER — Encounter: Admitting: Student

## 2024-02-11 ENCOUNTER — Encounter: Payer: Self-pay | Admitting: Surgical

## 2024-02-11 ENCOUNTER — Encounter: Admitting: Surgical

## 2024-02-11 ENCOUNTER — Ambulatory Visit (INDEPENDENT_AMBULATORY_CARE_PROVIDER_SITE_OTHER): Admitting: Surgical

## 2024-02-11 VITALS — BP 101/70

## 2024-02-11 DIAGNOSIS — M546 Pain in thoracic spine: Secondary | ICD-10-CM

## 2024-02-11 DIAGNOSIS — N62 Hypertrophy of breast: Secondary | ICD-10-CM

## 2024-02-11 DIAGNOSIS — Z9889 Other specified postprocedural states: Secondary | ICD-10-CM

## 2024-02-11 NOTE — Progress Notes (Signed)
 Patient is a 28 y.o.-year-old female status post bilateral breast reduction with Dr.  Waddell. Patient is 7 weeks postop.  Patient was here in the office a few weeks ago with concerns of a wound to her left breast.  Patient reports that area has healed and she is doing really well.  She is not having any specific concerns or issues today.   Chaperone present on exam Bilateral NAC's are viable, bilateral breast incisions are intact. There is no erythema or cellulitic changes noted. No obvious subcutaneous fluid collections noted with palpation.   A/P:  Recommend continuing with compressive garment 24/7 until 6 weeks post-op,  avoiding strenuous activity/heavy lifting until 6 weeks post-op  Recommend following up  as needed.  We discussed increasing activity as tolerated.  No restrictions at this time.  Discussed continuing with compressive garments when active, but no longer necessary at night.  We discussed recommendations for scar cream and gentle massage to incisions for optimizing incisional healing.  All of the patient's questions were answered to their content. Recommend calling with any questions or concerns.  Pictures were obtained of the patient and placed in the chart with the patient's or guardian's permission.

## 2024-05-09 ENCOUNTER — Emergency Department (HOSPITAL_BASED_OUTPATIENT_CLINIC_OR_DEPARTMENT_OTHER)
Admission: EM | Admit: 2024-05-09 | Discharge: 2024-05-09 | Discharge status: Against Medical Advice | Attending: Emergency Medicine | Admitting: Emergency Medicine

## 2024-05-09 ENCOUNTER — Encounter (HOSPITAL_BASED_OUTPATIENT_CLINIC_OR_DEPARTMENT_OTHER): Payer: Self-pay

## 2024-05-09 ENCOUNTER — Other Ambulatory Visit: Payer: Self-pay

## 2024-05-09 DIAGNOSIS — R110 Nausea: Secondary | ICD-10-CM | POA: Diagnosis not present

## 2024-05-09 DIAGNOSIS — R1032 Left lower quadrant pain: Secondary | ICD-10-CM | POA: Diagnosis not present

## 2024-05-09 DIAGNOSIS — Z5321 Procedure and treatment not carried out due to patient leaving prior to being seen by health care provider: Secondary | ICD-10-CM | POA: Diagnosis not present

## 2024-05-09 LAB — CBC
HCT: 37 % (ref 36.0–46.0)
Hemoglobin: 11.8 g/dL — ABNORMAL LOW (ref 12.0–15.0)
MCH: 25.4 pg — ABNORMAL LOW (ref 26.0–34.0)
MCHC: 31.9 g/dL (ref 30.0–36.0)
MCV: 79.7 fL — ABNORMAL LOW (ref 80.0–100.0)
Platelets: 275 K/uL (ref 150–400)
RBC: 4.64 MIL/uL (ref 3.87–5.11)
RDW: 13.6 % (ref 11.5–15.5)
WBC: 3.6 K/uL — ABNORMAL LOW (ref 4.0–10.5)
nRBC: 0 % (ref 0.0–0.2)

## 2024-05-09 LAB — URINALYSIS, ROUTINE W REFLEX MICROSCOPIC
Bilirubin Urine: NEGATIVE
Glucose, UA: NEGATIVE mg/dL
Hgb urine dipstick: NEGATIVE
Ketones, ur: NEGATIVE mg/dL
Leukocytes,Ua: NEGATIVE
Nitrite: NEGATIVE
Protein, ur: NEGATIVE mg/dL
Specific Gravity, Urine: 1.014 (ref 1.005–1.030)
pH: 7.5 (ref 5.0–8.0)

## 2024-05-09 LAB — COMPREHENSIVE METABOLIC PANEL WITH GFR
ALT: 17 U/L (ref 0–44)
AST: 17 U/L (ref 15–41)
Albumin: 3.8 g/dL (ref 3.5–5.0)
Alkaline Phosphatase: 67 U/L (ref 38–126)
Anion gap: 9 (ref 5–15)
BUN: 10 mg/dL (ref 6–20)
CO2: 26 mmol/L (ref 22–32)
Calcium: 9.4 mg/dL (ref 8.9–10.3)
Chloride: 104 mmol/L (ref 98–111)
Creatinine, Ser: 0.65 mg/dL (ref 0.44–1.00)
GFR, Estimated: >60 mL/min
Glucose, Bld: 87 mg/dL (ref 70–99)
Potassium: 4.2 mmol/L (ref 3.5–5.1)
Sodium: 138 mmol/L (ref 135–145)
Total Bilirubin: <0.2 mg/dL (ref 0.0–1.2)
Total Protein: 6.5 g/dL (ref 6.5–8.1)

## 2024-05-09 LAB — LIPASE, BLOOD: Lipase: 42 U/L (ref 11–51)

## 2024-05-09 LAB — PREGNANCY, URINE: Preg Test, Ur: NEGATIVE

## 2024-05-09 NOTE — ED Triage Notes (Signed)
 Pt reports LLQ abd pain x1 week. Pt reports nausea. Pt denies any urinary S/S. Pt denies any hx of kidney stones.

## 2024-06-09 ENCOUNTER — Ambulatory Visit: Payer: Self-pay | Admitting: Obstetrics and Gynecology

## 2024-06-20 ENCOUNTER — Other Ambulatory Visit: Payer: Self-pay | Admitting: Obstetrics and Gynecology

## 2024-06-20 DIAGNOSIS — R102 Pelvic and perineal pain unspecified side: Secondary | ICD-10-CM

## 2024-06-25 ENCOUNTER — Ambulatory Visit (HOSPITAL_COMMUNITY)

## 2024-06-27 ENCOUNTER — Ambulatory Visit: Admitting: Obstetrics and Gynecology
